# Patient Record
Sex: Female | Born: 1960
Health system: Southern US, Community
[De-identification: ages and names within clinical notes are randomized; demographics above are authoritative.]

## PROBLEM LIST (undated history)

## (undated) DIAGNOSIS — K5909 Other constipation: Secondary | ICD-10-CM

## (undated) DIAGNOSIS — T7840XA Allergy, unspecified, initial encounter: Secondary | ICD-10-CM

## (undated) DIAGNOSIS — K8020 Calculus of gallbladder without cholecystitis without obstruction: Secondary | ICD-10-CM

## (undated) DIAGNOSIS — K760 Fatty (change of) liver, not elsewhere classified: Secondary | ICD-10-CM

## (undated) HISTORY — PX: GALLBLADDER SURGERY: SHX652

## (undated) HISTORY — DX: Other constipation: K59.09

## (undated) HISTORY — PX: CHOLECYSTECTOMY: SHX55

## (undated) HISTORY — PX: GASTRIC BYPASS: SHX52

## (undated) HISTORY — PX: BARIATRIC SURGERY: SHX1103

## (undated) HISTORY — DX: Allergy, unspecified, initial encounter: T78.40XA

## (undated) HISTORY — DX: Fatty (change of) liver, not elsewhere classified: K76.0

## (undated) HISTORY — DX: Calculus of gallbladder without cholecystitis without obstruction: K80.20

## (undated) HISTORY — PX: ENDOMETRIAL ABLATION: SHX621

## (undated) HISTORY — PX: DILATION AND CURETTAGE OF UTERUS: SHX78

---

## 1998-11-28 ENCOUNTER — Emergency Department (HOSPITAL_COMMUNITY): Admission: EM | Admit: 1998-11-28 | Discharge: 1998-11-28 | Payer: Self-pay | Admitting: Emergency Medicine

## 2000-02-25 ENCOUNTER — Other Ambulatory Visit: Admission: RE | Admit: 2000-02-25 | Discharge: 2000-02-25 | Payer: Self-pay | Admitting: Obstetrics and Gynecology

## 2006-09-16 ENCOUNTER — Encounter: Admission: RE | Admit: 2006-09-16 | Discharge: 2006-09-16 | Payer: Self-pay | Admitting: Obstetrics and Gynecology

## 2007-06-19 ENCOUNTER — Encounter: Admission: RE | Admit: 2007-06-19 | Discharge: 2007-06-19 | Payer: Self-pay | Admitting: Family Medicine

## 2008-10-24 ENCOUNTER — Encounter (INDEPENDENT_AMBULATORY_CARE_PROVIDER_SITE_OTHER): Payer: Self-pay | Admitting: Obstetrics and Gynecology

## 2008-10-24 ENCOUNTER — Ambulatory Visit (HOSPITAL_COMMUNITY): Admission: RE | Admit: 2008-10-24 | Discharge: 2008-10-24 | Payer: Self-pay | Admitting: Obstetrics and Gynecology

## 2010-11-04 ENCOUNTER — Other Ambulatory Visit: Payer: Self-pay | Admitting: Obstetrics and Gynecology

## 2010-11-04 DIAGNOSIS — N6321 Unspecified lump in the left breast, upper outer quadrant: Secondary | ICD-10-CM

## 2010-11-10 ENCOUNTER — Ambulatory Visit
Admission: RE | Admit: 2010-11-10 | Discharge: 2010-11-10 | Disposition: A | Discharge status: Home / Self Care | Payer: BC Managed Care – PPO | Source: Ambulatory Visit | Attending: Obstetrics and Gynecology | Admitting: Obstetrics and Gynecology

## 2010-11-10 DIAGNOSIS — N6321 Unspecified lump in the left breast, upper outer quadrant: Secondary | ICD-10-CM

## 2011-01-11 LAB — BASIC METABOLIC PANEL
BUN: 9 mg/dL (ref 6–23)
CO2: 27 mEq/L (ref 19–32)
Calcium: 8.8 mg/dL (ref 8.4–10.5)
Chloride: 101 mEq/L (ref 96–112)
Creatinine, Ser: 0.69 mg/dL (ref 0.4–1.2)
GFR calc Af Amer: >60 mL/min (ref >60)
GFR calc non Af Amer: >60 mL/min (ref >60)
Glucose, Bld: 91 mg/dL (ref 70–99)
Potassium: 3.3 mEq/L — ABNORMAL LOW (ref 3.5–5.1)
Sodium: 134 mEq/L — ABNORMAL LOW (ref 135–145)

## 2011-01-11 LAB — PREGNANCY, URINE: Preg Test, Ur: NEGATIVE

## 2011-01-11 LAB — CBC
HCT: 43 % (ref 36.0–46.0)
Hemoglobin: 14.6 g/dL (ref 12.0–15.0)
MCHC: 33.9 g/dL (ref 30.0–36.0)
MCV: 84 fL (ref 78.0–100.0)
Platelets: 247 10*3/uL (ref 150–400)
RBC: 5.11 MIL/uL (ref 3.87–5.11)
RDW: 12.7 % (ref 11.5–15.5)
WBC: 7.2 10*3/uL (ref 4.0–10.5)

## 2011-02-09 NOTE — Op Note (Signed)
NAME:  STEPH, CHEADLE NO.:  [id number]   MEDICAL RECORD NO.:  [id number]          PATIENT TYPE:  AMB   LOCATION:  SDC                           FACILITY:  [facility]   PHYSICIAN:  Malva Limes, M.D.    DATE OF BIRTH:  Feb 02, 1961   DATE OF PROCEDURE:  DATE OF DISCHARGE:                               OPERATIVE REPORT   PREOPERATIVE DIAGNOSIS:  Menorrhagia.   POSTOPERATIVE DIAGNOSIS:  Menorrhagia.   PROCEDURE:  1. Dilation and curettage.  2. Endometrial ablation with NovaSure device.   SURGEON:  Malva Limes, MD   ANESTHESIA:  General.   ANTIBIOTICS:  Ancef 1 g.   DRAINS:  Red rubber catheter bladder.   SPECIMENS:  Endometrial curettings sent to Pathology.   COMPLICATIONS:  None.   PROCEDURE:  The patient was taken to the operating room where she was  placed in the dorsal supine position.  A general anesthetic was  administered without difficulty.  She was then placed in dorsal  lithotomy position.  She was prepped and draped in the usual fashion for  this procedure.  A sterile speculum placed in the vagina.  A 16 mL of 1%  lidocaine was used for paracervical block.  A single-tooth tenaculum was  applied to the anterior cervical lip.  The uterus was then sounded to 11  cm.  The cervix was then serially dilated to 27-French.  The cervical  length was measured at 3.5 cm.  The patient then had a sharp curettage  with tissue being sent to Pathology.  The NovaSure device was then  placed into the uterine cavity and the width was 4.4 cm.  A seal test  was then performed and passed.  the device was then turned on for a  total of 1 minute and 5 seconds giving 157 W of energy.  The patient  tolerated the procedure well.  The device was removed.  The patient was  then awakened and taken to recovery room in stable condition.  Instrument and lap counts were correct x1.  The patient will be  discharged to home.  She will be followed up in the office in 4 weeks.  She  will be sent home with Percocet to take p.r.n.           ______________________________  Malva Limes, M.D.     MA/MEDQ  D:  10/24/2008  T:  10/25/2008  Job:  [id number]

## 2012-02-18 ENCOUNTER — Other Ambulatory Visit: Payer: Self-pay | Admitting: Dermatology

## 2015-12-25 ENCOUNTER — Encounter: Payer: Self-pay | Admitting: Gastroenterology

## 2016-02-16 ENCOUNTER — Encounter: Payer: Self-pay | Admitting: *Deleted

## 2016-02-18 ENCOUNTER — Ambulatory Visit: Payer: BC Managed Care – PPO | Admitting: Gastroenterology

## 2016-02-18 ENCOUNTER — Telehealth: Payer: Self-pay | Admitting: *Deleted

## 2016-02-18 NOTE — Telephone Encounter (Signed)
No show letter mailed to patient. 

## 2017-01-07 DIAGNOSIS — D1801 Hemangioma of skin and subcutaneous tissue: Secondary | ICD-10-CM | POA: Diagnosis not present

## 2017-01-07 DIAGNOSIS — D225 Melanocytic nevi of trunk: Secondary | ICD-10-CM | POA: Diagnosis not present

## 2017-01-07 DIAGNOSIS — D2262 Melanocytic nevi of left upper limb, including shoulder: Secondary | ICD-10-CM | POA: Diagnosis not present

## 2017-01-07 DIAGNOSIS — L821 Other seborrheic keratosis: Secondary | ICD-10-CM | POA: Diagnosis not present

## 2017-01-07 DIAGNOSIS — D485 Neoplasm of uncertain behavior of skin: Secondary | ICD-10-CM | POA: Diagnosis not present

## 2017-02-09 DIAGNOSIS — L0202 Furuncle of face: Secondary | ICD-10-CM | POA: Diagnosis not present

## 2017-02-09 DIAGNOSIS — L0889 Other specified local infections of the skin and subcutaneous tissue: Secondary | ICD-10-CM | POA: Diagnosis not present

## 2017-03-01 ENCOUNTER — Other Ambulatory Visit: Payer: Self-pay | Admitting: Obstetrics and Gynecology

## 2017-03-01 DIAGNOSIS — N6452 Nipple discharge: Secondary | ICD-10-CM | POA: Diagnosis not present

## 2017-03-01 DIAGNOSIS — N6341 Unspecified lump in right breast, subareolar: Secondary | ICD-10-CM | POA: Diagnosis not present

## 2017-03-01 DIAGNOSIS — Z01419 Encounter for gynecological examination (general) (routine) without abnormal findings: Secondary | ICD-10-CM | POA: Diagnosis not present

## 2017-03-01 DIAGNOSIS — Z124 Encounter for screening for malignant neoplasm of cervix: Secondary | ICD-10-CM | POA: Diagnosis not present

## 2017-03-01 DIAGNOSIS — R102 Pelvic and perineal pain: Secondary | ICD-10-CM | POA: Diagnosis not present

## 2017-03-01 DIAGNOSIS — N63 Unspecified lump in unspecified breast: Secondary | ICD-10-CM

## 2017-03-02 ENCOUNTER — Other Ambulatory Visit: Payer: Self-pay | Admitting: Obstetrics and Gynecology

## 2017-03-02 DIAGNOSIS — N6452 Nipple discharge: Secondary | ICD-10-CM

## 2017-03-02 DIAGNOSIS — N63 Unspecified lump in unspecified breast: Secondary | ICD-10-CM

## 2017-03-07 ENCOUNTER — Ambulatory Visit
Admission: RE | Admit: 2017-03-07 | Discharge: 2017-03-07 | Disposition: A | Discharge status: Home / Self Care | Payer: BLUE CROSS/BLUE SHIELD | Source: Ambulatory Visit | Attending: Obstetrics and Gynecology | Admitting: Obstetrics and Gynecology

## 2017-03-07 ENCOUNTER — Other Ambulatory Visit: Payer: Self-pay | Admitting: Obstetrics and Gynecology

## 2017-03-07 DIAGNOSIS — N63 Unspecified lump in unspecified breast: Secondary | ICD-10-CM

## 2017-03-07 DIAGNOSIS — N644 Mastodynia: Secondary | ICD-10-CM | POA: Diagnosis not present

## 2017-03-07 DIAGNOSIS — N6452 Nipple discharge: Secondary | ICD-10-CM

## 2017-03-14 ENCOUNTER — Other Ambulatory Visit: Payer: Self-pay | Admitting: Obstetrics and Gynecology

## 2017-03-14 DIAGNOSIS — R921 Mammographic calcification found on diagnostic imaging of breast: Secondary | ICD-10-CM

## 2017-03-16 DIAGNOSIS — R102 Pelvic and perineal pain: Secondary | ICD-10-CM | POA: Diagnosis not present

## 2017-09-06 ENCOUNTER — Other Ambulatory Visit: Payer: BLUE CROSS/BLUE SHIELD

## 2017-09-12 ENCOUNTER — Other Ambulatory Visit: Payer: Self-pay

## 2017-09-14 ENCOUNTER — Ambulatory Visit
Admission: RE | Admit: 2017-09-14 | Discharge: 2017-09-14 | Disposition: A | Discharge status: Home / Self Care | Payer: PRIVATE HEALTH INSURANCE | Source: Ambulatory Visit | Attending: Obstetrics and Gynecology | Admitting: Obstetrics and Gynecology

## 2017-09-14 ENCOUNTER — Other Ambulatory Visit: Payer: Self-pay | Admitting: Obstetrics and Gynecology

## 2017-09-14 DIAGNOSIS — R921 Mammographic calcification found on diagnostic imaging of breast: Secondary | ICD-10-CM

## 2017-09-28 ENCOUNTER — Encounter: Payer: Self-pay | Admitting: Physician Assistant

## 2017-09-28 ENCOUNTER — Other Ambulatory Visit: Payer: Self-pay

## 2017-09-28 ENCOUNTER — Ambulatory Visit (INDEPENDENT_AMBULATORY_CARE_PROVIDER_SITE_OTHER): Payer: BLUE CROSS/BLUE SHIELD | Admitting: Physician Assistant

## 2017-09-28 ENCOUNTER — Ambulatory Visit (INDEPENDENT_AMBULATORY_CARE_PROVIDER_SITE_OTHER): Payer: BLUE CROSS/BLUE SHIELD

## 2017-09-28 VITALS — BP 136/88 | HR 77 | Temp 98.3°F | Resp 20 | Ht 63.78 in | Wt 281.6 lb

## 2017-09-28 DIAGNOSIS — R7611 Nonspecific reaction to tuberculin skin test without active tuberculosis: Secondary | ICD-10-CM | POA: Diagnosis not present

## 2017-09-28 NOTE — Progress Notes (Signed)
Brandy Lewis  MRN: [id number] DOB: [birth year]  Subjective:  Brandy Lewis is a 57 y.o. female seen in office today for a chief complaint of need of chest x-ray.  Patient tested positive for TB via skin test 20 years ago.  She followed up with health department and completed treatment for 1 year.  She had repeat chest x-rays every 3 months for a year and then every 10 years after that.  She has not had one in about 10 years.  Her chest x-rays have always been negative.  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  Patient  has never been symptomatic and remains asymptomatic.  She denies cough, night sweats, hemoptysis, fever, chills, weight loss, and fatigue.  She has no other questions or concerns today.  Review of Systems  Per HPI  There are no active problems to display for this patient.   No current outpatient medications on file prior to visit.   No current facility-administered medications on file prior to visit.     Allergies  Allergen Reactions  . Diflucan [Fluconazole]   . Latex    Social History   Socioeconomic History  . Marital status: Married    Spouse name: Elta Guadeloupe  . Number of children: 1  . Years of education: Not on file  . Highest education level: Not on file  Social Needs  . Financial resource strain: Not on file  . Food insecurity - worry: Not on file  . Food insecurity - inability: Not on file  . Transportation needs - medical: Not on file  . Transportation needs - non-medical: Not on file  Occupational History  . Not on file  Tobacco Use  . Smoking status: Never Smoker  . Smokeless tobacco: Never Used  Substance and Sexual Activity  . Alcohol use: No    Frequency: Never  . Drug use: No  . Sexual activity: Yes  Other Topics Concern  . Not on file  Social History Narrative  . Not on file      Objective:  BP 136/88 (BP Location: Left Arm, Patient Position: Sitting, Cuff Size: Large)   Pulse 77   Temp 98.3  F (36.8 C) (Oral)   Resp 20   Ht 5' 3.78" (1.62 m)   Wt 281 lb 9.6 oz (127.7 kg)   SpO2 99%   BMI 48.67 kg/m   Physical Exam  Constitutional: She is oriented to person, place, and time and well-developed, well-nourished, and in no distress.  HENT:  Head: Normocephalic and atraumatic.  Eyes: Conjunctivae are normal.  Neck: Normal range of motion.  Cardiovascular: Normal rate, regular rhythm and normal heart sounds.  Pulmonary/Chest: Effort normal and breath sounds normal. She has no wheezes. She has no rhonchi. She has no rales.  Neurological: She is alert and oriented to person, place, and time. Gait normal.  Skin: Skin is warm and dry.  Psychiatric: Affect normal.  Vitals reviewed.   Dg Chest 2 View  Result Date: 09/28/2017 CLINICAL DATA:  Positive TB test 20 ears ago, pre-employment EXAM: CHEST  2 VIEW COMPARISON:  None. FINDINGS: Normal heart size, mediastinal contours, and pulmonary vascularity. Lungs clear. No pleural effusion or pneumothorax. Bones unremarkable. IMPRESSION: Normal exam. Electronically Signed   By: Lavonia Dana M.D.   On: 09/28/2017 16:42    Assessment and Plan :  1. Positive TB test Chest x-ray normal.  Will have CMA fax CXR results to [facility]  [place] at [phone].  Follow-up as needed. - DG Chest 2 View; Future  Tenna Delaine PA-C  [facility] 09/28/2017 4:18 PM

## 2017-09-28 NOTE — Patient Instructions (Addendum)
Please go to 102 for chest xray. We will fax your results to the number provided. Thank you for letting me participate in your health and well being.     IF you received an x-ray today, you will receive an invoice from [facility]. Please contact [facility] at [phone] with questions or concerns regarding your invoice.   IF you received labwork today, you will receive an invoice from [facility]. Please contact [facility] at [phone] with questions or concerns regarding your invoice.   Our billing staff will not be able to assist you with questions regarding bills from these companies.  You will be contacted with the lab results as soon as they are available. The fastest way to get your results is to activate your My Chart account. Instructions are located on the last page of this paperwork. If you have not heard from [place] regarding the results in 2 weeks, please contact this office.

## 2017-10-13 DIAGNOSIS — N6323 Unspecified lump in the left breast, lower outer quadrant: Secondary | ICD-10-CM | POA: Diagnosis not present

## 2017-10-13 DIAGNOSIS — R921 Mammographic calcification found on diagnostic imaging of breast: Secondary | ICD-10-CM | POA: Diagnosis not present

## 2017-10-18 DIAGNOSIS — D225 Melanocytic nevi of trunk: Secondary | ICD-10-CM | POA: Diagnosis not present

## 2017-10-18 DIAGNOSIS — D485 Neoplasm of uncertain behavior of skin: Secondary | ICD-10-CM | POA: Diagnosis not present

## 2018-03-17 ENCOUNTER — Other Ambulatory Visit: Payer: PRIVATE HEALTH INSURANCE

## 2018-06-27 ENCOUNTER — Encounter: Payer: Self-pay | Admitting: Gastroenterology

## 2018-08-02 ENCOUNTER — Ambulatory Visit (INDEPENDENT_AMBULATORY_CARE_PROVIDER_SITE_OTHER): Payer: 59 | Admitting: Gastroenterology

## 2018-08-02 ENCOUNTER — Encounter: Payer: Self-pay | Admitting: Gastroenterology

## 2018-08-02 VITALS — BP 130/86 | HR 76 | Ht 63.0 in | Wt 289.4 lb

## 2018-08-02 DIAGNOSIS — K625 Hemorrhage of anus and rectum: Secondary | ICD-10-CM | POA: Diagnosis not present

## 2018-08-02 DIAGNOSIS — K6289 Other specified diseases of anus and rectum: Secondary | ICD-10-CM | POA: Diagnosis not present

## 2018-08-02 MED ORDER — NA SULFATE-K SULFATE-MG SULF 17.5-3.13-1.6 GM/177ML PO SOLN: ORAL | 0 refills | Status: DC

## 2018-08-02 MED ORDER — AMBULATORY NON FORMULARY MEDICATION: 1 refills | Status: DC

## 2018-08-02 NOTE — H&P (View-Only) (Signed)
Brandy Lewis    [id number]    [birth year]  Primary Care Physician:Slatosky, Marshall Cork., MD  Referring Physician: Enid Skeens., MD [address], [place] [address]  Chief complaint:  Rectal bleeding, rectal pain  HPI: 57 year old female here for new patient visit complains of intermittent bright red blood per rectum and symptomatic hemorrhoids. Has had issues with hemorrhoids since pregnancy 25 years ago She noticed a nodule between hemorrhoid and skin tag, its painful to sit and to have a bowel movement and also having intermittent bright red blood per rectum with increased frequency in the past  3 to 4 months  BM about once a day, no recent change in bowel habits. Maternal uncle with colon cancer in his 35's, Mom had lung cancer. Denies any abdominal pain, loss of appetite or weight loss.    Outpatient Encounter Medications as of 08/02/2018  Medication Sig  . fexofenadine (ALLEGRA ALLERGY) 180 MG tablet Take 1 tablet by mouth daily.   No facility-administered encounter medications on file as of 08/02/2018.     Allergies as of 08/02/2018 - Review Complete 08/02/2018  Allergen Reaction Noted  . Diflucan [fluconazole]  09/28/2017  . Latex  09/28/2017  . Tape Rash 07/18/2018    Past Medical History:  Diagnosis Date  . Allergy   . Fatty liver   . Gallstones     Past Surgical History:  Procedure Laterality Date  . BARIATRIC SURGERY    . DILATION AND CURETTAGE OF UTERUS    . ENDOMETRIAL ABLATION    . GALLBLADDER SURGERY    . GASTRIC BYPASS      Family History  Problem Relation Age of Onset  . Lung cancer Mother   . Diabetes Father   . Heart disease Father     Social History   Socioeconomic History  . Marital status: Married    Spouse name: Elta Guadeloupe  . Number of children: 1  . Years of education: Not on file  . Highest education level: Not on file  Occupational History  . Not on file  Social Needs  . Financial resource strain: Not on  file  . Food insecurity:    Worry: Not on file    Inability: Not on file  . Transportation needs:    Medical: Not on file    Non-medical: Not on file  Tobacco Use  . Smoking status: Never Smoker  . Smokeless tobacco: Never Used  Substance and Sexual Activity  . Alcohol use: No    Frequency: Never  . Drug use: No  . Sexual activity: Yes  Lifestyle  . Physical activity:    Days per week: Not on file    Minutes per session: Not on file  . Stress: Not on file  Relationships  . Social connections:    Talks on phone: Not on file    Gets together: Not on file    Attends religious service: Not on file    Active member of club or organization: Not on file    Attends meetings of clubs or organizations: Not on file    Relationship status: Not on file  . Intimate partner violence:    Fear of current or ex partner: Not on file    Emotionally abused: Not on file    Physically abused: Not on file    Forced sexual activity: Not on file  Other Topics Concern  . Not on file  Social History Narrative  .  Not on file      Review of systems: Review of Systems  Constitutional: Negative for fever and chills.  HENT: Negative.   Eyes: Negative for blurred vision.  Respiratory: Negative for cough, shortness of breath and wheezing.   Cardiovascular: Negative for chest pain and palpitations.  Gastrointestinal: as per HPI Genitourinary: Negative for dysuria, urgency, frequency and hematuria.  Musculoskeletal: Negative for myalgias, back pain and joint pain.  Skin: Negative for itching and rash.  Neurological: Negative for dizziness, tremors, focal weakness, seizures and loss of consciousness.  Endo/Heme/Allergies: Positive for seasonal allergies.  Psychiatric/Behavioral: Negative for depression, suicidal ideas and hallucinations.  All other systems reviewed and are negative.   Physical Exam: Vitals:   08/02/18 0855  BP: 130/86  Pulse: 76   Body mass index is 51.26 kg/m. Gen:       No acute distress HEENT:  EOMI, sclera anicteric Neck:     No masses; no thyromegaly Lungs:    Clear to auscultation bilaterally; normal respiratory effort CV:         Regular rate and rhythm; no murmurs Abd:      + bowel sounds; soft, non-tender; no palpable masses, no distension Ext:    No edema; adequate peripheral perfusion Skin:      Warm and dry; no rash Neuro: alert and oriented x 3 Psych: normal mood and affect Rectal exam: Normal anal sphincter tone, significant rectal tenderness, firm nodule with ?defect/ulcer/ fissure palpable posteriorly .  Anoscopy: not performed due to significant pain Data Reviewed:  Reviewed labs, radiology imaging, old records and pertinent past GI work up   Assessment and Plan/Recommendations:  57 year old female with history of GERD, fatty liver, morbid obesity and family history of colon cancer in second-degree relative with complaints of intermittent rectal bleeding.  Abnormal rectal digital exam with firm palpable nodule and significant rectal tenderness. Scheduled for colonoscopy with biopsy for further evaluation The risks and benefits as well as alternatives of endoscopic procedure(s) have been discussed and reviewed. All questions answered. The patient agrees to proceed. Benefiber 1 teaspoon 3 times daily with meals RectiCare small pea-sized amount per rectum as needed and apply nitroglycerin 0.125% small pea-sized amount 3 times daily to improve rectal discomfort   K. Denzil Magnuson , MD [phone]    CC: Enid Skeens., MD

## 2018-08-02 NOTE — Progress Notes (Signed)
Brandy Lewis    [id number]    05-13-1961  Primary Care Physician:Slatosky, Marshall Cork., MD  Referring Physician: Enid Skeens., MD [address], [place] [address]  Chief complaint:  Rectal bleeding, rectal pain  HPI: 57 year old female here for new patient visit complains of intermittent bright red blood per rectum and symptomatic hemorrhoids. Has had issues with hemorrhoids since pregnancy 25 years ago She noticed a nodule between hemorrhoid and skin tag, its painful to sit and to have a bowel movement and also having intermittent bright red blood per rectum with increased frequency in the past  3 to 4 months  BM about once a day, no recent change in bowel habits. Maternal uncle with colon cancer in his 17's, Mom had lung cancer. Denies any abdominal pain, loss of appetite or weight loss.    Outpatient Encounter Medications as of 08/02/2018  Medication Sig  . fexofenadine (ALLEGRA ALLERGY) 180 MG tablet Take 1 tablet by mouth daily.   No facility-administered encounter medications on file as of 08/02/2018.     Allergies as of 08/02/2018 - Review Complete 08/02/2018  Allergen Reaction Noted  . Diflucan [fluconazole]  09/28/2017  . Latex  09/28/2017  . Tape Rash 07/18/2018    Past Medical History:  Diagnosis Date  . Allergy   . Fatty liver   . Gallstones     Past Surgical History:  Procedure Laterality Date  . BARIATRIC SURGERY    . DILATION AND CURETTAGE OF UTERUS    . ENDOMETRIAL ABLATION    . GALLBLADDER SURGERY    . GASTRIC BYPASS      Family History  Problem Relation Age of Onset  . Lung cancer Mother   . Diabetes Father   . Heart disease Father     Social History   Socioeconomic History  . Marital status: Married    Spouse name: Elta Guadeloupe  . Number of children: 1  . Years of education: Not on file  . Highest education level: Not on file  Occupational History  . Not on file  Social Needs  . Financial resource strain: Not on  file  . Food insecurity:    Worry: Not on file    Inability: Not on file  . Transportation needs:    Medical: Not on file    Non-medical: Not on file  Tobacco Use  . Smoking status: Never Smoker  . Smokeless tobacco: Never Used  Substance and Sexual Activity  . Alcohol use: No    Frequency: Never  . Drug use: No  . Sexual activity: Yes  Lifestyle  . Physical activity:    Days per week: Not on file    Minutes per session: Not on file  . Stress: Not on file  Relationships  . Social connections:    Talks on phone: Not on file    Gets together: Not on file    Attends religious service: Not on file    Active member of club or organization: Not on file    Attends meetings of clubs or organizations: Not on file    Relationship status: Not on file  . Intimate partner violence:    Fear of current or ex partner: Not on file    Emotionally abused: Not on file    Physically abused: Not on file    Forced sexual activity: Not on file  Other Topics Concern  . Not on file  Social History Narrative  .  Not on file      Review of systems: Review of Systems  Constitutional: Negative for fever and chills.  HENT: Negative.   Eyes: Negative for blurred vision.  Respiratory: Negative for cough, shortness of breath and wheezing.   Cardiovascular: Negative for chest pain and palpitations.  Gastrointestinal: as per HPI Genitourinary: Negative for dysuria, urgency, frequency and hematuria.  Musculoskeletal: Negative for myalgias, back pain and joint pain.  Skin: Negative for itching and rash.  Neurological: Negative for dizziness, tremors, focal weakness, seizures and loss of consciousness.  Endo/Heme/Allergies: Positive for seasonal allergies.  Psychiatric/Behavioral: Negative for depression, suicidal ideas and hallucinations.  All other systems reviewed and are negative.   Physical Exam: Vitals:   08/02/18 0855  BP: 130/86  Pulse: 76   Body mass index is 51.26 kg/m. Gen:       No acute distress HEENT:  EOMI, sclera anicteric Neck:     No masses; no thyromegaly Lungs:    Clear to auscultation bilaterally; normal respiratory effort CV:         Regular rate and rhythm; no murmurs Abd:      + bowel sounds; soft, non-tender; no palpable masses, no distension Ext:    No edema; adequate peripheral perfusion Skin:      Warm and dry; no rash Neuro: alert and oriented x 3 Psych: normal mood and affect Rectal exam: Normal anal sphincter tone, significant rectal tenderness, firm nodule with ?defect/ulcer/ fissure palpable posteriorly .  Anoscopy: not performed due to significant pain Data Reviewed:  Reviewed labs, radiology imaging, old records and pertinent past GI work up   Assessment and Plan/Recommendations:  57 year old female with history of GERD, fatty liver, morbid obesity and family history of colon cancer in second-degree relative with complaints of intermittent rectal bleeding.  Abnormal rectal digital exam with firm palpable nodule and significant rectal tenderness. Scheduled for colonoscopy with biopsy for further evaluation The risks and benefits as well as alternatives of endoscopic procedure(s) have been discussed and reviewed. All questions answered. The patient agrees to proceed. Benefiber 1 teaspoon 3 times daily with meals RectiCare small pea-sized amount per rectum as needed and apply nitroglycerin 0.125% small pea-sized amount 3 times daily to improve rectal discomfort   K. Denzil Magnuson , MD [phone]    CC: Enid Skeens., MD

## 2018-08-02 NOTE — Patient Instructions (Addendum)
We have scheduled you for a hospital case on 08/10/2018 at 2pm, separate instructions have been given   Take Benefiber 1 teaspoon three tines a day with meals  You will purchase Recticare OTC to mix with Nitroglycerin three times a day  If you are age 56 or older, your body mass index should be between 23-30. Your Body mass index is 51.26 kg/m. If this is out of the aforementioned range listed, please consider follow up with your Primary Care Provider.  If you are age 95 or younger, your body mass index should be between 19-25. Your Body mass index is 51.26 kg/m. If this is out of the aformentioned range listed, please consider follow up with your Primary Care Provider.    Thank you for choosing [facility]  Karleen Hampshire Nandigam,MD

## 2018-08-03 ENCOUNTER — Telehealth: Payer: Self-pay | Admitting: *Deleted

## 2018-08-03 NOTE — Telephone Encounter (Signed)
Called patient and left message that her colon has been rescheduled for 08/11/2018 at 1pm to arrive at 11:30am

## 2018-08-10 ENCOUNTER — Telehealth: Payer: Self-pay

## 2018-08-10 NOTE — Telephone Encounter (Signed)
SENT REFERRAL TO SCHEDULING AND FILED NOTES 

## 2018-08-11 ENCOUNTER — Encounter (HOSPITAL_COMMUNITY)
Admission: RE | Disposition: A | Discharge status: Home / Self Care | Payer: Self-pay | Source: Ambulatory Visit | Attending: Gastroenterology

## 2018-08-11 ENCOUNTER — Ambulatory Visit (HOSPITAL_COMMUNITY): Payer: 59 | Admitting: Anesthesiology

## 2018-08-11 ENCOUNTER — Encounter: Payer: Self-pay | Admitting: Gastroenterology

## 2018-08-11 ENCOUNTER — Encounter (HOSPITAL_COMMUNITY): Payer: Self-pay | Admitting: Gastroenterology

## 2018-08-11 ENCOUNTER — Other Ambulatory Visit: Payer: Self-pay

## 2018-08-11 ENCOUNTER — Ambulatory Visit (HOSPITAL_COMMUNITY)
Admission: RE | Admit: 2018-08-11 | Discharge: 2018-08-11 | Disposition: A | Discharge status: Home / Self Care | Payer: 59 | Source: Ambulatory Visit | Attending: Gastroenterology | Admitting: Gastroenterology

## 2018-08-11 DIAGNOSIS — K922 Gastrointestinal hemorrhage, unspecified: Secondary | ICD-10-CM | POA: Diagnosis present

## 2018-08-11 DIAGNOSIS — K5730 Diverticulosis of large intestine without perforation or abscess without bleeding: Secondary | ICD-10-CM | POA: Diagnosis not present

## 2018-08-11 DIAGNOSIS — K648 Other hemorrhoids: Secondary | ICD-10-CM | POA: Insufficient documentation

## 2018-08-11 DIAGNOSIS — Z800 Family history of malignant neoplasm of digestive organs: Secondary | ICD-10-CM | POA: Insufficient documentation

## 2018-08-11 DIAGNOSIS — K6289 Other specified diseases of anus and rectum: Secondary | ICD-10-CM

## 2018-08-11 DIAGNOSIS — Z6841 Body Mass Index (BMI) 40.0 and over, adult: Secondary | ICD-10-CM | POA: Insufficient documentation

## 2018-08-11 DIAGNOSIS — K621 Rectal polyp: Secondary | ICD-10-CM | POA: Insufficient documentation

## 2018-08-11 DIAGNOSIS — K625 Hemorrhage of anus and rectum: Secondary | ICD-10-CM

## 2018-08-11 DIAGNOSIS — Z9884 Bariatric surgery status: Secondary | ICD-10-CM | POA: Insufficient documentation

## 2018-08-11 DIAGNOSIS — D122 Benign neoplasm of ascending colon: Secondary | ICD-10-CM | POA: Insufficient documentation

## 2018-08-11 HISTORY — PX: POLYPECTOMY: SHX5525

## 2018-08-11 HISTORY — PX: COLONOSCOPY WITH PROPOFOL: SHX5780

## 2018-08-11 HISTORY — PX: BIOPSY: SHX5522

## 2018-08-11 SURGERY — COLONOSCOPY WITH PROPOFOL
Anesthesia: Monitor Anesthesia Care

## 2018-08-11 MED ORDER — MIDAZOLAM HCL 2 MG/2ML IJ SOLN: 0.5000 mg | Freq: Once | INTRAMUSCULAR | Status: DC | PRN

## 2018-08-11 MED ORDER — SODIUM CHLORIDE 0.9 % IV SOLN: INTRAVENOUS | Status: DC

## 2018-08-11 MED ORDER — HYDRALAZINE HCL 20 MG/ML IJ SOLN
INTRAMUSCULAR | Status: DC | PRN
  Administered 2018-08-11: 5 mg via INTRAVENOUS

## 2018-08-11 MED ORDER — PROPOFOL 10 MG/ML IV BOLUS
INTRAVENOUS | Status: AC
  Filled 2018-08-11: qty 20

## 2018-08-11 MED ORDER — MEPERIDINE HCL 25 MG/ML IJ SOLN: 6.2500 mg | INTRAMUSCULAR | Status: DC | PRN

## 2018-08-11 MED ORDER — LACTATED RINGERS IV SOLN
INTRAVENOUS | Status: DC
  Administered 2018-08-11: 1000 mL via INTRAVENOUS

## 2018-08-11 MED ORDER — LIDOCAINE HCL (PF) 2 % IJ SOLN
INTRAMUSCULAR | Status: DC | PRN
  Administered 2018-08-11: 30 mg via INTRADERMAL

## 2018-08-11 MED ORDER — PROMETHAZINE HCL 25 MG/ML IJ SOLN: 6.2500 mg | INTRAMUSCULAR | Status: DC | PRN

## 2018-08-11 MED ORDER — PROPOFOL 10 MG/ML IV BOLUS
INTRAVENOUS | Status: DC | PRN
  Administered 2018-08-11 (×10): 20 mg via INTRAVENOUS
  Administered 2018-08-11 (×3): 50 mg via INTRAVENOUS
  Administered 2018-08-11 (×3): 20 mg via INTRAVENOUS
  Administered 2018-08-11: 50 mg via INTRAVENOUS

## 2018-08-11 MED ORDER — PROPOFOL 10 MG/ML IV BOLUS
INTRAVENOUS | Status: AC
  Filled 2018-08-11: qty 40

## 2018-08-11 SURGICAL SUPPLY — 22 items

## 2018-08-11 NOTE — Interval H&P Note (Signed)
History and Physical Interval Note:  08/11/2018 12:05 PM  Brandy Lewis  has presented today for surgery, with the diagnosis of BRBPR  Rectal pain  BMI greater than 50  The various methods of treatment have been discussed with the patient and family. After consideration of risks, benefits and other options for treatment, the patient has consented to  Procedure(s): COLONOSCOPY WITH PROPOFOL ([name]) as a surgical intervention .  The patient's history has been reviewed, patient examined, no change in status, stable for surgery.  I have reviewed the patient's chart and labs.  Questions were answered to the patient's satisfaction.     Kavitha Nandigam

## 2018-08-11 NOTE — Discharge Instructions (Signed)
YOU HAD AN ENDOSCOPIC PROCEDURE TODAY: Refer to the procedure report and other information in the discharge instructions given to you for any specific questions about what was found during the examination. If this information does not answer your questions, please call [place] office at [phone] to clarify.  ° °YOU SHOULD EXPECT: Some feelings of bloating in the abdomen. Passage of more gas than usual. Walking can help get rid of the air that was put into your GI tract during the procedure and reduce the bloating. If you had a lower endoscopy (such as a colonoscopy or flexible sigmoidoscopy) you may notice spotting of blood in your stool or on the toilet paper. Some abdominal soreness may be present for a day or two, also. ° °DIET: Your first meal following the procedure should be a light meal and then it is ok to progress to your normal diet. A half-sandwich or bowl of soup is an example of a good first meal. Heavy or fried foods are harder to digest and may make you feel nauseous or bloated. Drink plenty of fluids but you should avoid alcoholic beverages for 24 hours. If you had a esophageal dilation, please see attached instructions for diet.   ° °ACTIVITY: Your care partner should take you home directly after the procedure. You should plan to take it easy, moving slowly for the rest of the day. You can resume normal activity the day after the procedure however YOU SHOULD NOT DRIVE, use power tools, machinery or perform tasks that involve climbing or major physical exertion for 24 hours (because of the sedation medicines used during the test).  ° °SYMPTOMS TO REPORT IMMEDIATELY: °A gastroenterologist can be reached at any hour. Please call [phone]  for any of the following symptoms:  °Following lower endoscopy (colonoscopy, flexible sigmoidoscopy) °Excessive amounts of blood in the stool  °Significant tenderness, worsening of abdominal pains  °Swelling of the abdomen that is new, acute  °Fever of 100° or  higher  °Following upper endoscopy (EGD, EUS, ERCP, esophageal dilation) °Vomiting of blood or coffee ground material  °New, significant abdominal pain  °New, significant chest pain or pain under the shoulder blades  °Painful or persistently difficult swallowing  °New shortness of breath  °Black, tarry-looking or red, bloody stools ° °FOLLOW UP:  °If any biopsies were taken you will be contacted by phone or by letter within the next 1-3 weeks. Call [phone]  if you have not heard about the biopsies in 3 weeks.  °Please also call with any specific questions about appointments or follow up tests. ° °

## 2018-08-11 NOTE — Anesthesia Postprocedure Evaluation (Signed)
Anesthesia Post Note  Patient: Skyah Hannon  Procedure(s) Performed: COLONOSCOPY WITH PROPOFOL ([name] ) POLYPECTOMY BIOPSY     Patient location during evaluation: Endoscopy Anesthesia Type: MAC Level of consciousness: awake and alert, oriented and patient cooperative Pain management: pain level controlled Vital Signs Assessment: post-procedure vital signs reviewed and stable Respiratory status: spontaneous breathing, nonlabored ventilation and respiratory function stable Cardiovascular status: blood pressure returned to baseline and stable Postop Assessment: no apparent nausea or vomiting, adequate PO intake and able to ambulate Anesthetic complications: no    Last Vitals:  Vitals:   08/11/18 1340 08/11/18 1350  BP: 115/88 132/78  Pulse: 73 72  Resp: 20 14  Temp:    SpO2: 100% 100%    Last Pain:  Vitals:   08/11/18 1350  TempSrc:   PainSc: 0-No pain                 Luis Nickles,E. Ariannie Penaloza

## 2018-08-11 NOTE — Transfer of Care (Signed)
Immediate Anesthesia Transfer of Care Note  Patient: Brandy Lewis  Procedure(s) Performed: COLONOSCOPY WITH PROPOFOL ([name] ) POLYPECTOMY BIOPSY  Patient Location: PACU and Endoscopy Unit  Anesthesia Type:MAC  Level of Consciousness: awake, alert  and oriented  Airway & Oxygen Therapy: Patient Spontanous Breathing and Patient connected to face mask oxygen  Post-op Assessment: Report given to RN and Post -op Vital signs reviewed and stable  Post vital signs: Reviewed and stable  Last Vitals:  Vitals Value Taken Time  BP 129/68 08/11/2018  1:31 PM  Temp 36.6 C 08/11/2018  1:31 PM  Pulse 74 08/11/2018  1:33 PM  Resp 20 08/11/2018  1:33 PM  SpO2 100 % 08/11/2018  1:33 PM  Vitals shown include unvalidated device data.  Last Pain:  Vitals:   08/11/18 1331  TempSrc: Oral  PainSc: 0-No pain         Complications: No apparent anesthesia complications

## 2018-08-11 NOTE — Anesthesia Preprocedure Evaluation (Addendum)
Anesthesia Evaluation  Patient identified by MRN, date of birth, ID band Patient awake    Reviewed: Allergy & Precautions, NPO status , Patient's Chart, lab work & pertinent test results  History of Anesthesia Complications Negative for: history of anesthetic complications  Airway Mallampati: II  TM Distance: >3 FB Neck ROM: Full    Dental  (+) Dental Advisory Given   Pulmonary neg pulmonary ROS,    breath sounds clear to auscultation       Cardiovascular negative cardio ROS   Rhythm:Regular Rate:Normal     Neuro/Psych negative neurological ROS     GI/Hepatic Neg liver ROS, GERD  Controlled,  Endo/Other  Morbid obesity  Renal/GU negative Renal ROS     Musculoskeletal   Abdominal (+) + obese,   Peds  Hematology negative hematology ROS (+)   Anesthesia Other Findings   Reproductive/Obstetrics                            Anesthesia Physical Anesthesia Plan  ASA: II  Anesthesia Plan: MAC   Post-op Pain Management:    Induction:   PONV Risk Score and Plan: 2 and Treatment may vary due to age or medical condition  Airway Management Planned: Nasal Cannula and Natural Airway  Additional Equipment:   Intra-op Plan:   Post-operative Plan:   Informed Consent: I have reviewed the patients History and Physical, chart, labs and discussed the procedure including the risks, benefits and alternatives for the proposed anesthesia with the patient or authorized representative who has indicated his/her understanding and acceptance.   Dental advisory given  Plan Discussed with: CRNA and Surgeon  Anesthesia Plan Comments: (Plan routine monitors, MAC)        Anesthesia Quick Evaluation

## 2018-08-11 NOTE — Op Note (Signed)
[facility] Patient Name: Brandy Lewis Procedure Date: 08/11/2018 MRN: [id number] Attending MD: Mauri Pole , MD Date of Birth: 1960/12/17 CSN: [id number] Age: 57 Admit Type: Inpatient Procedure:                Colonoscopy Indications:              Evaluation of unexplained GI bleeding Providers:                Mauri Pole, MD, Zenon Mayo, RN, Alan Mulder, Technician, Karis Juba, CRNA Referring MD:              Medicines:                Monitored Anesthesia Care Complications:            No immediate complications. Estimated Blood Loss:     Estimated blood loss was minimal. Procedure:                Pre-Anesthesia Assessment:                           - Prior to the procedure, a History and Physical                            was performed, and patient medications and                            allergies were reviewed. The patient's tolerance of                            previous anesthesia was also reviewed. The risks                            and benefits of the procedure and the sedation                            options and risks were discussed with the patient.                            All questions were answered, and informed consent                            was obtained. Prior Anticoagulants: The patient has                            taken no previous anticoagulant or antiplatelet                            agents. ASA Grade Assessment: III - A patient with                            severe systemic disease. After reviewing the risks  and benefits, the patient was deemed in                            satisfactory condition to undergo the procedure.                           After obtaining informed consent, the colonoscope                            was passed under direct vision. Throughout the                            procedure, the patient's blood pressure, pulse, and                             oxygen saturations were monitored continuously. The                            PCF-[id number] ([id number] Olympus peds colonoscope was                            introduced through the anus and advanced to the the                            cecum, identified by appendiceal orifice and                            ileocecal valve. The colonoscopy was performed                            without difficulty. The patient tolerated the                            procedure well. The quality of the bowel                            preparation was excellent. The ileocecal valve,                            appendiceal orifice, and rectum were photographed. Scope In: 1:06:58 PM Scope Out: 1:23:20 PM Scope Withdrawal Time: 0 hours 13 minutes 6 seconds  Total Procedure Duration: 0 hours 16 minutes 22 seconds  Findings:      The perianal and digital rectal examinations were normal.      A 2 mm polyp was found in the ascending colon. The polyp was sessile.       The polyp was removed with a cold biopsy forceps. Resection and       retrieval were complete.      Anal papilla(e) were hypertrophied. Biopsies were taken with a cold       forceps for histology.      Scattered small-mouthed diverticula were found in the sigmoid colon.      Non-bleeding internal hemorrhoids were found during retroflexion. The       hemorrhoids were medium-sized. Impression:               -  One 2 mm polyp in the ascending colon, removed                            with a cold biopsy forceps. Resected and retrieved.                           - Anal papilla(e) were hypertrophied. Biopsied.                           - Diverticulosis in the sigmoid colon.                           - Non-bleeding internal hemorrhoids. Moderate Sedation:      Not Applicable - Patient had care per Anesthesia. Recommendation:           - Patient has a contact number available for                            emergencies. The signs  and symptoms of potential                            delayed complications were discussed with the                            patient. Return to normal activities tomorrow.                            Written discharge instructions were provided to the                            patient.                           - Resume previous diet.                           - Continue present medications.                           - Await pathology results.                           - Repeat colonoscopy in 5-10 years for surveillance                            based on pathology results. Procedure Code(s):        --- Professional ---                           [phone], Colonoscopy, flexible; with biopsy, single                            or multiple Diagnosis Code(s):        --- Professional ---  D12.2, Benign neoplasm of ascending colon                           K62.89, Other specified diseases of anus and rectum                           K64.8, Other hemorrhoids                           K92.2, Gastrointestinal hemorrhage, unspecified                           K57.30, Diverticulosis of large intestine without                            perforation or abscess without bleeding CPT copyright 2018 [employer]. All rights reserved. The codes documented in this report are preliminary and upon coder review may  be revised to meet current compliance requirements. Mauri Pole, MD 08/11/2018 1:28:51 PM This report has been signed electronically. Number of Addenda: 0

## 2018-09-21 ENCOUNTER — Ambulatory Visit: Payer: 59 | Admitting: Gastroenterology

## 2019-01-25 DIAGNOSIS — L0889 Other specified local infections of the skin and subcutaneous tissue: Secondary | ICD-10-CM | POA: Diagnosis not present

## 2019-01-25 DIAGNOSIS — K130 Diseases of lips: Secondary | ICD-10-CM | POA: Diagnosis not present

## 2019-02-12 DIAGNOSIS — Z1231 Encounter for screening mammogram for malignant neoplasm of breast: Secondary | ICD-10-CM | POA: Diagnosis not present

## 2019-04-26 DIAGNOSIS — D485 Neoplasm of uncertain behavior of skin: Secondary | ICD-10-CM | POA: Diagnosis not present

## 2019-04-26 DIAGNOSIS — L570 Actinic keratosis: Secondary | ICD-10-CM | POA: Diagnosis not present

## 2019-04-26 DIAGNOSIS — D1801 Hemangioma of skin and subcutaneous tissue: Secondary | ICD-10-CM | POA: Diagnosis not present

## 2019-04-26 DIAGNOSIS — D2262 Melanocytic nevi of left upper limb, including shoulder: Secondary | ICD-10-CM | POA: Diagnosis not present

## 2019-04-26 DIAGNOSIS — D225 Melanocytic nevi of trunk: Secondary | ICD-10-CM | POA: Diagnosis not present

## 2019-05-08 DIAGNOSIS — D485 Neoplasm of uncertain behavior of skin: Secondary | ICD-10-CM | POA: Diagnosis not present

## 2019-05-08 DIAGNOSIS — D2262 Melanocytic nevi of left upper limb, including shoulder: Secondary | ICD-10-CM | POA: Diagnosis not present

## 2019-06-03 ENCOUNTER — Ambulatory Visit: Payer: BC Managed Care – PPO

## 2019-06-03 ENCOUNTER — Ambulatory Visit
Admission: EM | Admit: 2019-06-03 | Discharge: 2019-06-03 | Disposition: A | Discharge status: Home / Self Care | Payer: BC Managed Care – PPO | Attending: Physician Assistant | Admitting: Physician Assistant

## 2019-06-03 ENCOUNTER — Other Ambulatory Visit: Payer: Self-pay

## 2019-06-03 ENCOUNTER — Ambulatory Visit (INDEPENDENT_AMBULATORY_CARE_PROVIDER_SITE_OTHER): Payer: BC Managed Care – PPO

## 2019-06-03 DIAGNOSIS — S99912A Unspecified injury of left ankle, initial encounter: Secondary | ICD-10-CM | POA: Diagnosis not present

## 2019-06-03 DIAGNOSIS — M25572 Pain in left ankle and joints of left foot: Secondary | ICD-10-CM | POA: Diagnosis not present

## 2019-06-03 MED ORDER — TRAMADOL HCL 50 MG PO TABS: 50.0000 mg | ORAL_TABLET | Freq: Four times a day (QID) | ORAL | 0 refills | Status: DC | PRN

## 2019-06-03 NOTE — ED Triage Notes (Signed)
Per pt she was at church today and had wedges on and fell on stage and trolled her left ankle. No obvious deformity. Some swelling.

## 2019-06-03 NOTE — ED Provider Notes (Signed)
[facility]    CSN: [id number] Arrival date & time: 06/03/19  1344      History   Chief Complaint Chief Complaint  Patient presents with   Ankle Pain    HPI Kayliegh Jerzak is a 58 y.o. female.   58 year old female comes in for left ankle pain after injury. She was in heels when she twisted her ankle and fell. Unsure if inverted or everted ankle. Her pain is to the lateral ankle/foot with pain at rest, worse with weightbearing and ROM. She notices swelling without contusion, erythema. Has done ice with minimal relief.      Past Medical History:  Diagnosis Date   Allergy    Fatty liver    Gallstones     Patient Active Problem List   Diagnosis Date Noted   BRBPR (bright red blood per rectum)    Morbid obesity with body mass index (BMI) greater than or equal to 50 [facility])    Rectal pain     Past Surgical History:  Procedure Laterality Date   BARIATRIC SURGERY     BIOPSY  08/11/2018   Procedure: BIOPSY;  Surgeon: Mauri Pole, MD;  Location: [facility];  Service: Endoscopy;;   COLONOSCOPY WITH PROPOFOL [name] 08/11/2018   Procedure: COLONOSCOPY WITH PROPOFOL;  Surgeon: Mauri Pole, MD;  Location: [facility];  Service: Endoscopy;  Laterality: [name];   DILATION AND CURETTAGE OF UTERUS     ENDOMETRIAL ABLATION     GALLBLADDER SURGERY     GASTRIC BYPASS     POLYPECTOMY  08/11/2018   Procedure: POLYPECTOMY;  Surgeon: Mauri Pole, MD;  Location: [facility];  Service: Endoscopy;;    OB History   No obstetric history on file.      Home Medications    Prior to Admission medications   Medication Sig Start Date End Date Taking? Authorizing Provider  acetaminophen (TYLENOL) 500 MG tablet Take 1,000 mg by mouth daily as needed for moderate pain or headache.    [provider]  AMBULATORY NON FORMULARY MEDICATION Medication Name: 0.125% Nitroglycerin ointment  Use Pea sized amount tectally three times a day with the  Recticare 08/02/18   Nandigam, Venia Minks, MD  BIOTIN PO Take 1 tablet by mouth daily.    [provider]  Cyanocobalamin (B-12 PO) Take 1 tablet by mouth daily.    [provider]  diphenhydrAMINE HCl (ZZZQUIL) 50 MG/30ML LIQD Take 50 mg by mouth at bedtime as needed (sleep).    [provider]  fexofenadine (ALLEGRA ALLERGY) 180 MG tablet Take 180 mg by mouth daily.     [provider]  FOLIC ACID PO Take 1 tablet by mouth daily.    [provider]  Multiple Vitamins-Minerals (ADULT GUMMY) CHEW Chew 4 tablets by mouth daily.    [provider]  omeprazole (PRILOSEC OTC) 20 MG tablet Take 20 mg by mouth daily.    [provider]  OVER THE COUNTER MEDICATION Take 1 capsule by mouth daily. diurex otc supplement    [provider]  OVER THE COUNTER MEDICATION Take 2 tablets by mouth daily. Amberen otc supplement    [provider]  traMADol (ULTRAM) 50 MG tablet Take 1 tablet (50 mg total) by mouth every 6 (six) hours as needed. 06/03/19   Ok Edwards, PA-C    Family History Family History  Problem Relation Age of Onset   Lung cancer Mother    Diabetes Father  Heart disease Father     Social History Social History   Tobacco Use   Smoking status: Never Smoker   Smokeless tobacco: Never Used  Substance Use Topics   Alcohol use: No    Frequency: Never   Drug use: No     Allergies   Diflucan [fluconazole], Latex, and Tape   Review of Systems Review of Systems  Reason unable to perform ROS: See HPI as above.     Physical Exam Triage Vital Signs ED Triage Vitals [06/03/19 1354]  Enc Vitals Group     BP (!) 166/93     Pulse Rate 78     Resp 16     Temp 97.6 F (36.4 C)     Temp Source Oral     SpO2 97 %     Weight      Height      Head Circumference      Peak Flow      Pain Score 7     Pain Loc      Pain Edu?      Excl. in [place]?    No data found.  Updated Vital Signs BP (!)  166/93 (BP Location: Left Wrist)    Pulse 78    Temp 97.6 F (36.4 C) (Oral)    Resp 16    SpO2 97%   Physical Exam Constitutional:      General: She is not in acute distress.    Appearance: She is well-developed. She is not diaphoretic.  HENT:     Head: Normocephalic and atraumatic.  Eyes:     Conjunctiva/sclera: Conjunctivae normal.     Pupils: Pupils are equal, round, and reactive to light.  Pulmonary:     Effort: Pulmonary effort is normal. No respiratory distress.  Musculoskeletal:     Comments: Swelling to the left lateral ankle. Tenderness to palpation along lateral malleolus. No tenderness to palpation of dorsal aspect of ankle/foot. States dorsal aspect of foot with decreased sensation. Full ROM of ankle. Pedal pulse 2+  Neurological:     Mental Status: She is alert and oriented to person, place, and time.     UC Treatments / Results  Labs (all labs ordered are listed, but only abnormal results are displayed) Labs Reviewed - No data to display  EKG   Radiology Dg Ankle Complete Left  Result Date: 06/03/2019 CLINICAL DATA:  LEFT ankle pain after falling at church today while wearing wedge heels EXAM: LEFT ANKLE COMPLETE - 3+ VIEW COMPARISON:  None FINDINGS: Osseous mineralization normal. Joint spaces preserved, with spurring noted at naviculocuneiform joint dorsally. Soft tissue swelling at ankle, especially laterally and anteriorly. Non fused ossicle at medial joint line, corticated and old. Calcific density identified at dorsal margin of distal talus, question age-indeterminate fracture. No additional fracture, dislocation, or bone destruction. Large plantar calcaneal spur. IMPRESSION: Questionable age-indeterminate fracture fragment at dorsal margin of distal talus; recommend correlation for pain/tenderness at this site. No other focal osseous abnormalities. Electronically Signed   By: Lavonia Dana M.D.   On: 06/03/2019 14:14    Procedures Procedures (including critical  care time)  Medications Ordered in UC Medications - No data to display  Initial Impression / Assessment and Plan / UC Course  I have reviewed the triage vital signs and the nursing notes.  Pertinent labs & imaging results that were available during my care of the patient were reviewed by me and considered in my medical decision making (see chart for details).  Discussed xray results with patient. No obvious tenderness to palpation of distal talus. However, given painful weightbearing with pain at rest, will provide cam walker. Patient with crutches and knee scooter available at home. Patient unable to take NSAIDs due to h/o gastric bypass. Will have patient start tylenol/voltaren gel. Tramadol as needed for further pain relief. Return precautions given. Patient expresses understanding and agrees to plan.  Final Clinical Impressions(s) / UC Diagnoses   Final diagnoses:  Acute left ankle pain    ED Prescriptions    Medication Sig Dispense Auth. Provider   traMADol (ULTRAM) 50 MG tablet Take 1 tablet (50 mg total) by mouth every 6 (six) hours as needed. 15 tablet Ok Edwards, PA-C     Controlled Substance Prescriptions [place] Controlled Substance Registry consulted? Yes, I have consulted the [place] Controlled Substances Registry for this patient, and feel the risk/benefit ratio today is favorable for proceeding with this prescription for a controlled substance.   Ok Edwards, PA-C 06/03/19 1454

## 2019-06-03 NOTE — Discharge Instructions (Signed)
As discussed, questionable fracture to the top of your ankle/foot. Start tylenol, voltaren gel, ice compress for pain. Tramadol for breakthrough pain. Follow up with orthopedics for further evaluation and management needed if symptoms not improving.

## 2019-06-27 DIAGNOSIS — R51 Headache: Secondary | ICD-10-CM | POA: Diagnosis not present

## 2019-06-27 DIAGNOSIS — Z20828 Contact with and (suspected) exposure to other viral communicable diseases: Secondary | ICD-10-CM | POA: Diagnosis not present

## 2019-08-08 ENCOUNTER — Emergency Department (HOSPITAL_BASED_OUTPATIENT_CLINIC_OR_DEPARTMENT_OTHER): Payer: BC Managed Care – PPO

## 2019-08-08 ENCOUNTER — Encounter (HOSPITAL_BASED_OUTPATIENT_CLINIC_OR_DEPARTMENT_OTHER): Payer: Self-pay | Admitting: Emergency Medicine

## 2019-08-08 ENCOUNTER — Emergency Department (HOSPITAL_BASED_OUTPATIENT_CLINIC_OR_DEPARTMENT_OTHER)
Admission: EM | Admit: 2019-08-08 | Discharge: 2019-08-08 | Disposition: A | Discharge status: Home / Self Care | Payer: BC Managed Care – PPO | Attending: Emergency Medicine | Admitting: Emergency Medicine

## 2019-08-08 ENCOUNTER — Other Ambulatory Visit: Payer: Self-pay

## 2019-08-08 DIAGNOSIS — S8002XA Contusion of left knee, initial encounter: Secondary | ICD-10-CM

## 2019-08-08 DIAGNOSIS — S52591A Other fractures of lower end of right radius, initial encounter for closed fracture: Secondary | ICD-10-CM | POA: Diagnosis not present

## 2019-08-08 DIAGNOSIS — S6991XA Unspecified injury of right wrist, hand and finger(s), initial encounter: Secondary | ICD-10-CM | POA: Diagnosis not present

## 2019-08-08 DIAGNOSIS — Y9301 Activity, walking, marching and hiking: Secondary | ICD-10-CM | POA: Diagnosis not present

## 2019-08-08 DIAGNOSIS — Z91040 Latex allergy status: Secondary | ICD-10-CM | POA: Diagnosis not present

## 2019-08-08 DIAGNOSIS — Z888 Allergy status to other drugs, medicaments and biological substances status: Secondary | ICD-10-CM | POA: Insufficient documentation

## 2019-08-08 DIAGNOSIS — Z91048 Other nonmedicinal substance allergy status: Secondary | ICD-10-CM | POA: Diagnosis not present

## 2019-08-08 DIAGNOSIS — Y92008 Other place in unspecified non-institutional (private) residence as the place of occurrence of the external cause: Secondary | ICD-10-CM | POA: Diagnosis not present

## 2019-08-08 DIAGNOSIS — S8992XA Unspecified injury of left lower leg, initial encounter: Secondary | ICD-10-CM | POA: Diagnosis not present

## 2019-08-08 DIAGNOSIS — W010XXA Fall on same level from slipping, tripping and stumbling without subsequent striking against object, initial encounter: Secondary | ICD-10-CM | POA: Diagnosis not present

## 2019-08-08 DIAGNOSIS — S52501A Unspecified fracture of the lower end of right radius, initial encounter for closed fracture: Secondary | ICD-10-CM

## 2019-08-08 DIAGNOSIS — S52601A Unspecified fracture of lower end of right ulna, initial encounter for closed fracture: Secondary | ICD-10-CM

## 2019-08-08 DIAGNOSIS — M25562 Pain in left knee: Secondary | ICD-10-CM | POA: Diagnosis not present

## 2019-08-08 DIAGNOSIS — Z79899 Other long term (current) drug therapy: Secondary | ICD-10-CM | POA: Insufficient documentation

## 2019-08-08 DIAGNOSIS — Y999 Unspecified external cause status: Secondary | ICD-10-CM | POA: Insufficient documentation

## 2019-08-08 DIAGNOSIS — S52611A Displaced fracture of right ulna styloid process, initial encounter for closed fracture: Secondary | ICD-10-CM | POA: Diagnosis not present

## 2019-08-08 NOTE — ED Provider Notes (Signed)
[place] [facility] DEPARTMENT Provider Note   CSN: [id number] Arrival date & time: 08/08/19  0756     History   Chief Complaint Chief Complaint  Patient presents with  . Fall  . Knee Pain  . Hand Pain    HPI Brandy Lewis is a 58 y.o. female.  She had a mechanical fall at home this morning.  She said she was in the garage getting up and and tripped over something falling striking her right hand and left knee.  She denies any head or neck or back pain.  No loss of consciousness.  Took some Aleve with minimal improvement.  No numbness or weakness.  No chest pain or abdominal pain.  Pain is to dorsum of right hand and wrist.  Throbbing in nature 7 out of 10.  Pain left knee over patella.  Throbbing 7 out of 10.     The history is provided by the patient.  Fall This is a new problem. The current episode started 1 to 2 hours ago. The problem occurs constantly. The problem has not changed since onset.Pertinent negatives include no chest pain, no abdominal pain, no headaches and no shortness of breath. The symptoms are aggravated by twisting, bending, standing and walking. The symptoms are relieved by position. She has tried rest for the symptoms. The treatment provided mild relief.    Past Medical History:  Diagnosis Date  . Allergy   . Fatty liver   . Gallstones     Patient Active Problem List   Diagnosis Date Noted  . BRBPR (bright red blood per rectum)   . Morbid obesity with body mass index (BMI) greater than or equal to 50 (HCC)   . Rectal pain     Past Surgical History:  Procedure Laterality Date  . BARIATRIC SURGERY    . BIOPSY  08/11/2018   Procedure: BIOPSY;  Surgeon: Mauri Pole, MD;  Location: [facility];  Service: Endoscopy;;  . COLONOSCOPY WITH PROPOFOL [name] 08/11/2018   Procedure: COLONOSCOPY WITH PROPOFOL;  Surgeon: Mauri Pole, MD;  Location: [facility];  Service: Endoscopy;  Laterality: [name];  . DILATION AND CURETTAGE OF  UTERUS    . ENDOMETRIAL ABLATION    . GALLBLADDER SURGERY    . GASTRIC BYPASS    . POLYPECTOMY  08/11/2018   Procedure: POLYPECTOMY;  Surgeon: Mauri Pole, MD;  Location: [facility];  Service: Endoscopy;;     OB History   No obstetric history on file.      Home Medications    Prior to Admission medications   Medication Sig Start Date End Date Taking? Authorizing Provider  acetaminophen (TYLENOL) 500 MG tablet Take 1,000 mg by mouth daily as needed for moderate pain or headache.    [provider]  AMBULATORY NON FORMULARY MEDICATION Medication Name: 0.125% Nitroglycerin ointment  Use Pea sized amount tectally three times a day with the Recticare 08/02/18   Nandigam, Venia Minks, MD  BIOTIN PO Take 1 tablet by mouth daily.    [provider]  Cyanocobalamin (B-12 PO) Take 1 tablet by mouth daily.    [provider]  diphenhydrAMINE HCl (ZZZQUIL) 50 MG/30ML LIQD Take 50 mg by mouth at bedtime as needed (sleep).    [provider]  fexofenadine (ALLEGRA ALLERGY) 180 MG tablet Take 180 mg by mouth daily.     [provider]  FOLIC ACID PO Take 1 tablet by mouth daily.    [provider]  Multiple  Vitamins-Minerals (ADULT GUMMY) CHEW Chew 4 tablets by mouth daily.    [provider]  omeprazole (PRILOSEC OTC) 20 MG tablet Take 20 mg by mouth daily.    [provider]  OVER THE COUNTER MEDICATION Take 1 capsule by mouth daily. diurex otc supplement    [provider]  OVER THE COUNTER MEDICATION Take 2 tablets by mouth daily. Amberen otc supplement    [provider]  traMADol (ULTRAM) 50 MG tablet Take 1 tablet (50 mg total) by mouth every 6 (six) hours as needed. 06/03/19   Ok Edwards, PA-C    Family History Family History  Problem Relation Age of Onset  . Lung cancer Mother   . Diabetes Father   . Heart disease Father     Social History Social History   Tobacco Use  . Smoking status:  Never Smoker  . Smokeless tobacco: Never Used  Substance Use Topics  . Alcohol use: No    Frequency: Never  . Drug use: No     Allergies   Diflucan [fluconazole], Latex, and Tape   Review of Systems Review of Systems  Constitutional: Negative for fever.  HENT: Negative for sore throat.   Eyes: Negative for visual disturbance.  Respiratory: Negative for shortness of breath.   Cardiovascular: Negative for chest pain.  Gastrointestinal: Negative for abdominal pain.  Genitourinary: Negative for hematuria.  Musculoskeletal: Negative for back pain and neck pain.  Skin: Positive for wound.  Neurological: Negative for headaches.     Physical Exam Updated Vital Signs BP (!) 148/97 (BP Location: Left Arm)   Pulse 69   Temp 97.9 F (36.6 C) (Oral)   Ht 5' 2.5" (1.588 m)   Wt 117.9 kg   SpO2 97%   BMI 46.80 kg/m   Physical Exam Constitutional:      Appearance: She is well-developed.  HENT:     Head: Normocephalic and atraumatic.  Eyes:     Conjunctiva/sclera: Conjunctivae normal.  Neck:     Musculoskeletal: Neck supple.  Cardiovascular:     Rate and Rhythm: Normal rate and regular rhythm.     Pulses: Normal pulses.  Pulmonary:     Effort: Pulmonary effort is normal.     Breath sounds: No wheezing.  Abdominal:     Tenderness: There is no abdominal tenderness. There is no guarding.  Musculoskeletal:        General: Tenderness and signs of injury present.     Comments: She has some swelling over the dorsum of hand and wrist.  Diffuse tenderness.  Small abrasion in the palm.  Digits nontender.  Cap refill and motor intact.  Radial pulse 2+.  Shoulder elbow nontender.  Left lower extremity some abrasion over her patella.  No joint line tenderness.  No ligamentous laxity.  Patella is tender.  Other extremities full range of motion without any pain or limitation.  Skin:    General: Skin is warm and dry.     Capillary Refill: Capillary refill takes less than 2 seconds.   Neurological:     General: No focal deficit present.     Mental Status: She is alert.     GCS: GCS eye subscore is 4. GCS verbal subscore is 5. GCS motor subscore is 6.     Sensory: No sensory deficit.     Motor: No weakness.      ED Treatments / Results  Labs (all labs ordered are listed, but only abnormal results are displayed) Labs Reviewed -  No data to display  EKG None  Radiology Dg Wrist Complete Right  Result Date: 08/08/2019 CLINICAL DATA:  Fall, pain EXAM: RIGHT WRIST - COMPLETE 3+ VIEW; RIGHT HAND - COMPLETE 3+ VIEW COMPARISON:  None. FINDINGS: There is a minimally impacted and angulated fracture of the distal right radial metadiaphysis with an additional minimally displaced fracture of the right ulnar styloid. No other fracture or dislocation of the right hand or right wrist. Joint spaces are well preserved. Soft tissue edema about the wrist. IMPRESSION: 1. There is a minimally impacted and angulated fracture of the distal right radial metadiaphysis with an additional minimally displaced fracture of the right ulnar styloid. No other fracture or dislocation of the right hand or right wrist. 2.  Soft tissue edema about the wrist. Electronically Signed   By: Eddie Candle M.D.   On: 08/08/2019 08:42   Dg Knee Complete 4 Views Left  Result Date: 08/08/2019 CLINICAL DATA:  Fall, pain EXAM: LEFT KNEE - COMPLETE 4+ VIEW COMPARISON:  None. FINDINGS: No fracture or dislocation of the left knee. There is mild medial compartment joint space narrowing and osteophytosis. No knee joint effusion. Soft tissues are unremarkable. IMPRESSION: No fracture or dislocation of the left knee. Electronically Signed   By: Eddie Candle M.D.   On: 08/08/2019 08:43   Dg Hand Complete Right  Result Date: 08/08/2019 CLINICAL DATA:  Fall, pain EXAM: RIGHT WRIST - COMPLETE 3+ VIEW; RIGHT HAND - COMPLETE 3+ VIEW COMPARISON:  None. FINDINGS: There is a minimally impacted and angulated fracture of the distal  right radial metadiaphysis with an additional minimally displaced fracture of the right ulnar styloid. No other fracture or dislocation of the right hand or right wrist. Joint spaces are well preserved. Soft tissue edema about the wrist. IMPRESSION: 1. There is a minimally impacted and angulated fracture of the distal right radial metadiaphysis with an additional minimally displaced fracture of the right ulnar styloid. No other fracture or dislocation of the right hand or right wrist. 2.  Soft tissue edema about the wrist. Electronically Signed   By: Eddie Candle M.D.   On: 08/08/2019 08:42    Procedures Procedures (including critical care time)  Medications Ordered in ED Medications - No data to display   Initial Impression / Assessment and Plan / ED Course  I have reviewed the triage vital signs and the nursing notes.  Pertinent labs & imaging results that were available during my care of the patient were reviewed by me and considered in my medical decision making (see chart for details).  Clinical Course as of Aug 07 1705  Wed Aug 08, 2019  0859 Patient here with mechanical fall complaining of right wrist hand and left knee pain.  Differential includes fracture, dislocation, sprain, contusion.  X-rays interpreted by me as distal radius and ulnar styloid fracture.  Hand consult placed.  Patient updated on results.   [MB]  0919 Discussed with Dr. Apolonio Schneiders hand surgery on-call.  He recommends splinting the patient and have her follow-up in the office on Friday.   [MB]  [phone] Patient works in home health care and she reached out to Dr. Vanetta Shawl office and is actually in a follow-up with him.   [MB]  [id number] Patient splinted by tech.  Normal CSMs after application.   [MB]    Clinical Course User Index [MB] Hayden Rasmussen, MD        Final Clinical Impressions(s) / ED Diagnoses   Final diagnoses:  Closed fracture  of distal ends of right radius and ulna, initial encounter  Contusion of  left knee, initial encounter    ED Discharge Orders    None       Hayden Rasmussen, MD 08/08/19 [phone]

## 2019-08-08 NOTE — ED Triage Notes (Signed)
Pt here after mechanical fall with pain to right hand and left knee. Took alieve this am with minimal relief.

## 2019-08-08 NOTE — Discharge Instructions (Signed)
You were seen in the emergency department for evaluation of injuries from a fall.  You have a distal radius and ulnar styloid fracture.  This was treated with a splint and you will need to keep this on clean and dry until you follow-up with orthopedic hand.  Your knee x-ray did not show any obvious fracture.  Ice, Tylenol and ibuprofen.  Return if any numbness weakness or other concerning symptoms.

## 2019-08-09 DIAGNOSIS — M25531 Pain in right wrist: Secondary | ICD-10-CM | POA: Diagnosis not present

## 2019-08-09 DIAGNOSIS — S52501A Unspecified fracture of the lower end of right radius, initial encounter for closed fracture: Secondary | ICD-10-CM | POA: Diagnosis not present

## 2019-08-20 DIAGNOSIS — S52501D Unspecified fracture of the lower end of right radius, subsequent encounter for closed fracture with routine healing: Secondary | ICD-10-CM | POA: Diagnosis not present

## 2019-08-20 DIAGNOSIS — M25531 Pain in right wrist: Secondary | ICD-10-CM | POA: Diagnosis not present

## 2019-09-13 DIAGNOSIS — M25531 Pain in right wrist: Secondary | ICD-10-CM | POA: Diagnosis not present

## 2019-09-13 DIAGNOSIS — S52501D Unspecified fracture of the lower end of right radius, subsequent encounter for closed fracture with routine healing: Secondary | ICD-10-CM | POA: Diagnosis not present

## 2019-10-11 DIAGNOSIS — M65312 Trigger thumb, left thumb: Secondary | ICD-10-CM | POA: Diagnosis not present

## 2019-10-11 DIAGNOSIS — S52501D Unspecified fracture of the lower end of right radius, subsequent encounter for closed fracture with routine healing: Secondary | ICD-10-CM | POA: Diagnosis not present

## 2019-11-27 DIAGNOSIS — D225 Melanocytic nevi of trunk: Secondary | ICD-10-CM | POA: Diagnosis not present

## 2019-11-27 DIAGNOSIS — L821 Other seborrheic keratosis: Secondary | ICD-10-CM | POA: Diagnosis not present

## 2019-11-27 DIAGNOSIS — D2262 Melanocytic nevi of left upper limb, including shoulder: Secondary | ICD-10-CM | POA: Diagnosis not present

## 2019-11-27 DIAGNOSIS — L814 Other melanin hyperpigmentation: Secondary | ICD-10-CM | POA: Diagnosis not present

## 2019-12-06 ENCOUNTER — Emergency Department (HOSPITAL_COMMUNITY)
Admission: EM | Admit: 2019-12-06 | Discharge: 2019-12-06 | Disposition: A | Discharge status: Home / Self Care | Payer: BC Managed Care – PPO | Attending: Emergency Medicine | Admitting: Emergency Medicine

## 2019-12-06 ENCOUNTER — Emergency Department (HOSPITAL_COMMUNITY): Payer: BC Managed Care – PPO

## 2019-12-06 ENCOUNTER — Other Ambulatory Visit: Payer: Self-pay

## 2019-12-06 ENCOUNTER — Encounter (HOSPITAL_COMMUNITY): Payer: Self-pay

## 2019-12-06 DIAGNOSIS — Z91040 Latex allergy status: Secondary | ICD-10-CM | POA: Diagnosis not present

## 2019-12-06 DIAGNOSIS — Z79899 Other long term (current) drug therapy: Secondary | ICD-10-CM | POA: Insufficient documentation

## 2019-12-06 DIAGNOSIS — R1031 Right lower quadrant pain: Secondary | ICD-10-CM | POA: Diagnosis not present

## 2019-12-06 DIAGNOSIS — Z9884 Bariatric surgery status: Secondary | ICD-10-CM | POA: Diagnosis not present

## 2019-12-06 DIAGNOSIS — Z9049 Acquired absence of other specified parts of digestive tract: Secondary | ICD-10-CM | POA: Diagnosis not present

## 2019-12-06 DIAGNOSIS — R109 Unspecified abdominal pain: Secondary | ICD-10-CM | POA: Diagnosis not present

## 2019-12-06 LAB — URINALYSIS, ROUTINE W REFLEX MICROSCOPIC
Bilirubin Urine: NEGATIVE
Glucose, UA: NEGATIVE mg/dL
Hgb urine dipstick: NEGATIVE
Ketones, ur: NEGATIVE mg/dL
Leukocytes,Ua: NEGATIVE
Nitrite: NEGATIVE
Protein, ur: NEGATIVE mg/dL
Specific Gravity, Urine: 1.031 — ABNORMAL HIGH (ref 1.005–1.030)
pH: 5 (ref 5.0–8.0)

## 2019-12-06 LAB — BASIC METABOLIC PANEL
Anion gap: 9 (ref 5–15)
BUN: 21 mg/dL — ABNORMAL HIGH (ref 6–20)
CO2: 22 mmol/L (ref 22–32)
Calcium: 9.1 mg/dL (ref 8.9–10.3)
Chloride: 110 mmol/L (ref 98–111)
Creatinine, Ser: 0.87 mg/dL (ref 0.44–1.00)
GFR calc Af Amer: >60 mL/min (ref >60)
GFR calc non Af Amer: >60 mL/min (ref >60)
Glucose, Bld: 114 mg/dL — ABNORMAL HIGH (ref 70–99)
Potassium: 4.2 mmol/L (ref 3.5–5.1)
Sodium: 141 mmol/L (ref 135–145)

## 2019-12-06 LAB — CBC
HCT: 43.8 % (ref 36.0–46.0)
Hemoglobin: 14.1 g/dL (ref 12.0–15.0)
MCH: 27.2 pg (ref 26.0–34.0)
MCHC: 32.2 g/dL (ref 30.0–36.0)
MCV: 84.4 fL (ref 80.0–100.0)
Platelets: 303 10*3/uL (ref 150–400)
RBC: 5.19 MIL/uL — ABNORMAL HIGH (ref 3.87–5.11)
RDW: 12.8 % (ref 11.5–15.5)
WBC: 7.7 10*3/uL (ref 4.0–10.5)
nRBC: 0 % (ref 0.0–0.2)

## 2019-12-06 LAB — I-STAT BETA HCG BLOOD, ED (MC, WL, AP ONLY): I-stat hCG, quantitative: <5 m[IU]/mL (ref <5)

## 2019-12-06 MED ORDER — MORPHINE SULFATE (PF) 4 MG/ML IV SOLN
4.0000 mg | Freq: Once | INTRAVENOUS | Status: AC
  Administered 2019-12-06: 4 mg via INTRAVENOUS
  Filled 2019-12-06: qty 1

## 2019-12-06 MED ORDER — KETOROLAC TROMETHAMINE 30 MG/ML IJ SOLN
30.0000 mg | Freq: Once | INTRAMUSCULAR | Status: AC
  Administered 2019-12-06: 30 mg via INTRAVENOUS
  Filled 2019-12-06: qty 1

## 2019-12-06 MED ORDER — SODIUM CHLORIDE 0.9 % IV SOLN: INTRAVENOUS | Status: DC

## 2019-12-06 MED ORDER — SODIUM CHLORIDE 0.9 % IV BOLUS
1000.0000 mL | Freq: Once | INTRAVENOUS | Status: AC
  Administered 2019-12-06: 1000 mL via INTRAVENOUS

## 2019-12-06 MED ORDER — ONDANSETRON HCL 4 MG/2ML IJ SOLN
4.0000 mg | Freq: Once | INTRAMUSCULAR | Status: AC
  Administered 2019-12-06: 4 mg via INTRAVENOUS
  Filled 2019-12-06: qty 2

## 2019-12-06 MED ORDER — METHOCARBAMOL 500 MG PO TABS: 500.0000 mg | ORAL_TABLET | Freq: Three times a day (TID) | ORAL | 0 refills | Status: DC | PRN

## 2019-12-06 NOTE — ED Provider Notes (Signed)
Brandy Lewis Provider Note   CSN: [id number] Arrival date & time: 12/06/19  1241     History Chief Complaint  Patient presents with  . Flank Pain    Brandy Lewis is a 59 y.o. female.  Pt presents to the ED today with right sided flank pain.  Pt said sx started yesterday, but it was worse today.  No hx of kidney stones, but she drinks a lot of tea.        Past Medical History:  Diagnosis Date  . Allergy   . Fatty liver   . Gallstones     Patient Active Problem List   Diagnosis Date Noted  . BRBPR (bright red blood per rectum)   . Morbid obesity with body mass index (BMI) greater than or equal to 50 (HCC)   . Rectal pain     Past Surgical History:  Procedure Laterality Date  . BARIATRIC SURGERY    . BIOPSY  08/11/2018   Procedure: BIOPSY;  Surgeon: Mauri Pole, MD;  Location: [facility];  Service: Endoscopy;;  . CHOLECYSTECTOMY    . COLONOSCOPY WITH PROPOFOL [name] 08/11/2018   Procedure: COLONOSCOPY WITH PROPOFOL;  Surgeon: Mauri Pole, MD;  Location: [facility];  Service: Endoscopy;  Laterality: [name];  . DILATION AND CURETTAGE OF UTERUS    . ENDOMETRIAL ABLATION    . GALLBLADDER SURGERY    . GASTRIC BYPASS    . POLYPECTOMY  08/11/2018   Procedure: POLYPECTOMY;  Surgeon: Mauri Pole, MD;  Location: [facility];  Service: Endoscopy;;     OB History   No obstetric history on file.     Family History  Problem Relation Age of Onset  . Lung cancer Mother   . Diabetes Father   . Heart disease Father     Social History   Tobacco Use  . Smoking status: Never Smoker  . Smokeless tobacco: Never Used  Substance Use Topics  . Alcohol use: No  . Drug use: No    Home Medications Prior to Admission medications   Medication Sig Start Date End Date Taking? Authorizing Provider  acetaminophen (TYLENOL) 500 MG tablet Take 1,000 mg by mouth daily as needed for moderate pain or headache.   Yes  [provider]  BIOTIN PO Take 1 tablet by mouth daily.   Yes [provider]  Cyanocobalamin (B-12 PO) Take 1 tablet by mouth daily.   Yes [provider]  fexofenadine (ALLEGRA ALLERGY) 180 MG tablet Take 180 mg by mouth daily.    Yes [provider]  FOLIC ACID PO Take 1 tablet by mouth daily.   Yes [provider]  Multiple Vitamins-Minerals (ADULT GUMMY) CHEW Chew 4 tablets by mouth daily.   Yes [provider]  naproxen sodium (ALEVE) 220 MG tablet Take 220 mg by mouth 2 (two) times daily as needed (pain/headache).   Yes [provider]  omeprazole (PRILOSEC OTC) 20 MG tablet Take 20 mg by mouth daily.   Yes [provider]  OVER THE COUNTER MEDICATION Take 1 capsule by mouth daily. diurex otc supplement   Yes [provider]  polyvinyl alcohol (LIQUIFILM TEARS) 1.4 % ophthalmic solution Place 1 drop into both eyes as needed for dry eyes.   Yes [provider]  AMBULATORY NON FORMULARY MEDICATION Medication Name: 0.125% Nitroglycerin ointment  Use Pea sized amount tectally three times a day with the Recticare Patient not taking: Reported on 12/06/2019 08/02/18   Nandigam,  Venia Minks, MD  methocarbamol (ROBAXIN) 500 MG tablet Take 1 tablet (500 mg total) by mouth every 8 (eight) hours as needed for muscle spasms. 12/06/19   Maudie Flakes, MD  traMADol (ULTRAM) 50 MG tablet Take 1 tablet (50 mg total) by mouth every 6 (six) hours as needed. Patient not taking: Reported on 12/06/2019 06/03/19   Ok Edwards, PA-C    Allergies    Diflucan [fluconazole], Latex, and Tape  Review of Systems   Review of Systems  Genitourinary: Positive for flank pain.    Physical Exam Updated Vital Signs BP (!) 161/90   Pulse 73   Temp 97.7 F (36.5 C) (Oral)   Resp 17   Ht 5\' 2"  (1.575 m)   Wt 122.5 kg   SpO2 98%   BMI 49.38 kg/m   Physical Exam Vitals and nursing note reviewed.  Constitutional:      Appearance:  Normal appearance.  HENT:     Head: Normocephalic and atraumatic.     Right Ear: External ear normal.     Left Ear: External ear normal.     Nose: Nose normal.     Mouth/Throat:     Mouth: Mucous membranes are moist.     Pharynx: Oropharynx is clear.  Eyes:     Extraocular Movements: Extraocular movements intact.     Conjunctiva/sclera: Conjunctivae normal.     Pupils: Pupils are equal, round, and reactive to light.  Cardiovascular:     Rate and Rhythm: Normal rate and regular rhythm.     Pulses: Normal pulses.     Heart sounds: Normal heart sounds.  Pulmonary:     Effort: Pulmonary effort is normal.     Breath sounds: Normal breath sounds.  Abdominal:     General: Abdomen is flat. Bowel sounds are normal.     Palpations: Abdomen is soft.  Musculoskeletal:        General: Normal range of motion.     Cervical back: Normal range of motion and neck supple.  Skin:    General: Skin is warm.     Capillary Refill: Capillary refill takes less than 2 seconds.  Neurological:     General: No focal deficit present.     Mental Status: She is alert and oriented to person, place, and time.  Psychiatric:        Mood and Affect: Mood normal.        Behavior: Behavior normal.        Thought Content: Thought content normal.        Judgment: Judgment normal.     ED Results / Procedures / Treatments   Labs (all labs ordered are listed, but only abnormal results are displayed) Labs Reviewed  URINALYSIS, ROUTINE W REFLEX MICROSCOPIC - Abnormal; Notable for the following components:      Result Value   Specific Gravity, Urine 1.031 (*)    All other components within normal limits  BASIC METABOLIC PANEL - Abnormal; Notable for the following components:   Glucose, Bld 114 (*)    BUN 21 (*)    All other components within normal limits  CBC - Abnormal; Notable for the following components:   RBC 5.19 (*)    All other components within normal limits  I-STAT BETA HCG BLOOD, ED ([facility], [facility], [facility]  ONLY)    EKG EKG Interpretation  Date/Time:  Thursday December 06 2019 16:37:38 EST Ventricular Rate:  75 PR Interval:    QRS Duration: 152 QT Interval:  430 QTC Calculation: 481 R Axis:   48 Text Interpretation: Sinus rhythm Right bundle branch block Confirmed by Gerlene Fee [phone]) on 12/06/2019 5:21:29 PM   Radiology CT RENAL STONE STUDY  Result Date: 12/06/2019 CLINICAL DATA:  Flank pain. EXAM: CT ABDOMEN AND PELVIS WITHOUT CONTRAST TECHNIQUE: Multidetector CT imaging of the abdomen and pelvis was performed following the standard protocol without IV contrast. COMPARISON:  None. FINDINGS: Lower chest: 5 mm pulmonary nodule within the RIGHT lower lobe (series 2, image 45). Subtle ground-glass opacity at the LEFT lung base (series 2, image 32). Hepatobiliary: No focal liver abnormality is seen. Status post cholecystectomy. No biliary dilatation. Pancreas: Unremarkable. No pancreatic ductal dilatation or surrounding inflammatory changes. Spleen: Normal in size without focal abnormality. Adrenals/Urinary Tract: Adrenal glands appear normal. Kidneys are unremarkable without mass, stone or hydronephrosis. No ureteral or bladder calculi identified. Bladder appears normal, partially decompressed. Stomach/Bowel: No dilated large or small bowel loops. No evidence of bowel wall inflammation. Appendix is normal. Surgical changes of gastric bypass. Vascular/Lymphatic: No significant vascular findings are present. No enlarged abdominal or pelvic lymph nodes. Reproductive: Uterus and bilateral adnexa are unremarkable. Other: No free fluid or abscess collection. No free intraperitoneal air. Musculoskeletal: Mild degenerative spondylosis within the lumbar spine. Associated disc bulge at L4-5 causing central canal stenosis of at least moderate degree with possible associated nerve root impingement. No acute or suspicious osseous finding. IMPRESSION: 1. No acute findings within the abdomen or pelvis. No renal or  ureteral calculi. No bowel obstruction or evidence of bowel wall inflammation. No evidence of acute solid organ abnormality. No free fluid. No evidence of acute osseous abnormality. 2. 5 mm pulmonary nodule within the RIGHT lower lobe. No follow-up needed if patient is low-risk. Non-contrast chest CT can be considered in 12 months if patient is high-risk. This recommendation follows the consensus statement: Guidelines for Management of Incidental Pulmonary Nodules Detected on CT Images: From the Fleischner Society 2017; Radiology 2017; 284:228-243. 3. Prominent disc bulge at L4-5 causing central canal stenosis of at least moderate degree with possible associated nerve root impingement. If symptoms could be radiculopathic in nature, would consider nonemergent lumbar spine MRI for further characterization. Electronically Signed   By: Franki Cabot M.D.   On: 12/06/2019 16:15    Procedures Procedures (including critical care time)  Medications Ordered in ED Medications  sodium chloride 0.9 % bolus 1,000 mL (0 mLs Intravenous Stopped 12/06/19 1425)  morphine 4 MG/ML injection 4 mg (4 mg Intravenous Given 12/06/19 1341)  ondansetron (ZOFRAN) injection 4 mg (4 mg Intravenous Given 12/06/19 1342)  morphine 4 MG/ML injection 4 mg (4 mg Intravenous Given 12/06/19 1521)  ketorolac (TORADOL) 30 MG/ML injection 30 mg (30 mg Intravenous Given 12/06/19 1521)    ED Course  I have reviewed the triage vital signs and the nursing notes.  Pertinent labs & imaging results that were available during my care of the patient were reviewed by me and considered in my medical decision making (see chart for details).    MDM Rules/Calculators/A&P                     Pain is still present, so more meds ordered.  Pt's labs are unremarkable.  CT pending at d/c.  Pt signed out to Dr. Sedonia Small at shift change.  Final Clinical Impression(s) / ED Diagnoses Final diagnoses:  Flank pain    Rx / DC Orders ED Discharge Orders  Ordered    methocarbamol (ROBAXIN) 500 MG tablet  Every 8 hours PRN     12/06/19 1729           Isla Pence, MD 12/08/19 [phone]

## 2019-12-06 NOTE — ED Notes (Signed)
Pt verbalizes understanding of DC instructions. Pt belongings returned and is ambulatory out of ED.  

## 2019-12-06 NOTE — Discharge Instructions (Addendum)
You were evaluated in the Emergency Department and after careful evaluation, we did not find any emergent condition requiring admission or further testing in the hospital.  Your exam/testing today is overall reassuring.  Your CT scan did not show any kidney stones or any other emergencies.  The best explanation for your pain is a muscle strain or spasm.  Please take Tylenol 1000 mg every 4-6 hours and/or ibuprofen 600 mg every 4-6 hours.  For more significant pain you can use the Robaxin muscle relaxer provided.  Please return to the Emergency Department if you experience any worsening of your condition.  We encourage you to follow up with a primary care provider.  Thank you for allowing [place] to be a part of your care.

## 2019-12-06 NOTE — ED Provider Notes (Signed)
  Provider Note MRN:  [id number]  Arrival date & time: 12/06/19    ED Course and Medical Decision Making  Assumed care from Dr. Gilford Raid at shift change.  Suspect kidney stone awaiting CT.  5:30 PM update: CT scan is without acute process, no stone.  Unclear etiology of patient's right flank pain, suspect MSK, is status post cholecystectomy, the pain is not pleuritic, no chest pain or shortness of breath, doubt PE.  Patient is feeling better, pain controlled, appropriate for discharge.  Procedures  Final Clinical Impressions(s) / ED Diagnoses     ICD-10-CM   1. Flank pain  R10.9     ED Discharge Orders         Ordered    methocarbamol (ROBAXIN) 500 MG tablet  Every 8 hours PRN     12/06/19 1729            Discharge Instructions     You were evaluated in the Emergency Department and after careful evaluation, we did not find any emergent condition requiring admission or further testing in the hospital.  Your exam/testing today is overall reassuring.  Your CT scan did not show any kidney stones or any other emergencies.  The best explanation for your pain is a muscle strain or spasm.  Please take Tylenol 1000 mg every 4-6 hours and/or ibuprofen 600 mg every 4-6 hours.  For more significant pain you can use the Robaxin muscle relaxer provided.  Please return to the Emergency Department if you experience any worsening of your condition.  We encourage you to follow up with a primary care provider.  Thank you for allowing [place] to be a part of your care.     Barth Kirks. Sedonia Small, [place] [email or web address]    Maudie Flakes, MD 12/06/19 1730

## 2019-12-06 NOTE — ED Triage Notes (Signed)
Patient c/o right flank pain that radiates to the right abdomen. Pain started yesterday. Patient denies any problems urinating.

## 2019-12-06 NOTE — ED Notes (Signed)
Pt ambulatory to RR independently  

## 2019-12-06 NOTE — ED Notes (Addendum)
Pt transported to CT ?

## 2019-12-11 DIAGNOSIS — R911 Solitary pulmonary nodule: Secondary | ICD-10-CM | POA: Diagnosis not present

## 2019-12-11 DIAGNOSIS — M62838 Other muscle spasm: Secondary | ICD-10-CM | POA: Diagnosis not present

## 2019-12-21 DIAGNOSIS — R509 Fever, unspecified: Secondary | ICD-10-CM | POA: Diagnosis not present

## 2019-12-21 DIAGNOSIS — R05 Cough: Secondary | ICD-10-CM | POA: Diagnosis not present

## 2019-12-21 DIAGNOSIS — R6883 Chills (without fever): Secondary | ICD-10-CM | POA: Diagnosis not present

## 2019-12-21 DIAGNOSIS — Z20828 Contact with and (suspected) exposure to other viral communicable diseases: Secondary | ICD-10-CM | POA: Diagnosis not present

## 2020-01-16 ENCOUNTER — Ambulatory Visit (INDEPENDENT_AMBULATORY_CARE_PROVIDER_SITE_OTHER): Payer: BC Managed Care – PPO | Admitting: Pulmonary Disease

## 2020-01-16 ENCOUNTER — Other Ambulatory Visit: Payer: Self-pay

## 2020-01-16 ENCOUNTER — Encounter: Payer: Self-pay | Admitting: Pulmonary Disease

## 2020-01-16 VITALS — BP 118/80 | HR 72 | Ht 62.5 in | Wt 292.6 lb

## 2020-01-16 DIAGNOSIS — R0683 Snoring: Secondary | ICD-10-CM

## 2020-01-16 DIAGNOSIS — R05 Cough: Secondary | ICD-10-CM

## 2020-01-16 DIAGNOSIS — R911 Solitary pulmonary nodule: Secondary | ICD-10-CM

## 2020-01-16 DIAGNOSIS — R918 Other nonspecific abnormal finding of lung field: Secondary | ICD-10-CM

## 2020-01-16 DIAGNOSIS — R059 Cough, unspecified: Secondary | ICD-10-CM

## 2020-01-16 MED ORDER — PULMICORT FLEXHALER 90 MCG/ACT IN AEPB: 1.0000 | INHALATION_SPRAY | Freq: Two times a day (BID) | RESPIRATORY_TRACT | 0 refills | Status: DC

## 2020-01-16 NOTE — Progress Notes (Signed)
Synopsis: Referred in April 2021 for lung nodule by Enid Skeens., MD  Subjective:   PATIENT ID: Brandy Lewis GENDER: female DOB: 1961-02-20, MRN: [id number]  Chief Complaint  Patient presents with  . Consult    Pt states she was seen in the ED about 1 month ago and had a CT performed. Pt stated that a nodule was seen on the scan. Pt states that she has had problems with allergies x6-8weeks. Pt states she has had a cough and throat clearing that is worse with exertion    This is a 59 year old female past medical history of allergies gallstones mother with a family history of lung cancer.  She is a lifelong never smoker.  Patient referred for evaluation of lung nodule.  Patient had a CT renal stone study completed in the emergency department which revealed a lower chest 5 mm pulmonary nodule as well as subtle groundglass opacity within the left lung base.  Patient was referred to pulmonary for further evaluation.  Patient states that her mother has a history of lung cancer diagnosed early in her 39s.  Unfortunately she had a longstanding history of smoking as well.  However this is a young age for diagnosis of lung cancer.  Of note she does complain of chronic cough.  Been going on for several months.  She is predominantly feels that this is annoying.  It is manageable but she does see a difference related to when her reflux flares.  She also has difficulty sleeping at night with falling asleep as well as snoring.  She has not seen a sleep physician before.   Past Medical History:  Diagnosis Date  . Allergy   . Fatty liver   . Gallstones      Family History  Problem Relation Age of Onset  . Lung cancer Mother   . Diabetes Father   . Heart disease Father      Past Surgical History:  Procedure Laterality Date  . BARIATRIC SURGERY    . BIOPSY  08/11/2018   Procedure: BIOPSY;  Surgeon: Mauri Pole, MD;  Location: [facility];  Service: Endoscopy;;  . CHOLECYSTECTOMY    .  COLONOSCOPY WITH PROPOFOL [name] 08/11/2018   Procedure: COLONOSCOPY WITH PROPOFOL;  Surgeon: Mauri Pole, MD;  Location: [facility];  Service: Endoscopy;  Laterality: [name];  . DILATION AND CURETTAGE OF UTERUS    . ENDOMETRIAL ABLATION    . GALLBLADDER SURGERY    . GASTRIC BYPASS    . POLYPECTOMY  08/11/2018   Procedure: POLYPECTOMY;  Surgeon: Mauri Pole, MD;  Location: [facility];  Service: Endoscopy;;    Social History   Socioeconomic History  . Marital status: Married    Spouse name: Elta Guadeloupe  . Number of children: 1  . Years of education: Not on file  . Highest education level: Not on file  Occupational History  . Not on file  Tobacco Use  . Smoking status: Never Smoker  . Smokeless tobacco: Never Used  Substance and Sexual Activity  . Alcohol use: No  . Drug use: No  . Sexual activity: Yes  Other Topics Concern  . Not on file  Social History Narrative  . Not on file   Social Determinants of Health   Financial Resource Strain:   . Difficulty of Paying Living Expenses:   Food Insecurity:   . Worried About Charity fundraiser in the Last Year:   . [place] in the Last Year:  Transportation Needs:   . Film/video editor (Medical):   Marland Kitchen Lack of Transportation (Non-Medical):   Physical Activity:   . Days of Exercise per Week:   . Minutes of Exercise per Session:   Stress:   . Feeling of Stress :   Social Connections:   . Frequency of Communication with Friends and Family:   . Frequency of Social Gatherings with Friends and Family:   . Attends Religious Services:   . Active Member of Clubs or Organizations:   . Attends Archivist Meetings:   Marland Kitchen Marital Status:   Intimate Partner Violence:   . Fear of Current or Ex-Partner:   . Emotionally Abused:   Marland Kitchen Physically Abused:   . Sexually Abused:      Allergies  Allergen Reactions  . Diflucan [Fluconazole]     Causes yeast infections   . Latex Rash  . Tape Rash      Outpatient Medications Prior to Visit  Medication Sig Dispense Refill  . BIOTIN PO Take 1 tablet by mouth daily.    . Cyanocobalamin (B-12 PO) Take 1 tablet by mouth daily.    . fexofenadine (ALLEGRA ALLERGY) 180 MG tablet Take 180 mg by mouth daily.     Marland Kitchen FOLIC ACID PO Take 1 tablet by mouth daily.    . Multiple Vitamins-Minerals (ADULT GUMMY) CHEW Chew 4 tablets by mouth daily.    Marland Kitchen omeprazole (PRILOSEC OTC) 20 MG tablet Take 20 mg by mouth daily.    Marland Kitchen OVER THE COUNTER MEDICATION Take 1 capsule by mouth daily. diurex otc supplement    . polyvinyl alcohol (LIQUIFILM TEARS) 1.4 % ophthalmic solution Place 1 drop into both eyes as needed for dry eyes.    Marland Kitchen acetaminophen (TYLENOL) 500 MG tablet Take 1,000 mg by mouth daily as needed for moderate pain or headache.    . naproxen sodium (ALEVE) 220 MG tablet Take 220 mg by mouth 2 (two) times daily as needed (pain/headache).    . AMBULATORY NON FORMULARY MEDICATION Medication Name: 0.125% Nitroglycerin ointment  Use Pea sized amount tectally three times a day with the Recticare (Patient not taking: Reported on 12/06/2019) 30 g 1  . methocarbamol (ROBAXIN) 500 MG tablet Take 1 tablet (500 mg total) by mouth every 8 (eight) hours as needed for muscle spasms. 30 tablet 0  . traMADol (ULTRAM) 50 MG tablet Take 1 tablet (50 mg total) by mouth every 6 (six) hours as needed. (Patient not taking: Reported on 12/06/2019) 15 tablet 0   No facility-administered medications prior to visit.    Review of Systems  Constitutional: Negative for chills, fever, malaise/fatigue and weight loss.  HENT: Negative for hearing loss, sore throat and tinnitus.   Eyes: Negative for blurred vision and double vision.  Respiratory: Positive for cough. Negative for hemoptysis, sputum production, shortness of breath, wheezing and stridor.   Cardiovascular: Negative for chest pain, palpitations, orthopnea, leg swelling and PND.  Gastrointestinal: Negative for abdominal pain,  constipation, diarrhea, heartburn, nausea and vomiting.  Genitourinary: Negative for dysuria, hematuria and urgency.  Musculoskeletal: Negative for joint pain and myalgias.  Skin: Negative for itching and rash.  Neurological: Negative for dizziness, tingling, weakness and headaches.  Endo/Heme/Allergies: Negative for environmental allergies. Does not bruise/bleed easily.  Psychiatric/Behavioral: Negative for depression. The patient is not nervous/anxious and does not have insomnia.   All other systems reviewed and are negative.    Objective:  Physical Exam Vitals reviewed.  Constitutional:      General:  She is not in acute distress.    Appearance: She is well-developed. She is obese.  HENT:     Head: Normocephalic and atraumatic.  Eyes:     General: No scleral icterus.    Conjunctiva/sclera: Conjunctivae normal.     Pupils: Pupils are equal, round, and reactive to light.  Neck:     Vascular: No JVD.     Trachea: No tracheal deviation.  Cardiovascular:     Rate and Rhythm: Normal rate and regular rhythm.     Heart sounds: Normal heart sounds. No murmur.  Pulmonary:     Effort: Pulmonary effort is normal. No tachypnea, accessory muscle usage or respiratory distress.     Breath sounds: Normal breath sounds. No stridor. No wheezing, rhonchi or rales.  Abdominal:     General: Bowel sounds are normal. There is no distension.     Palpations: Abdomen is soft.     Tenderness: There is no abdominal tenderness.     Comments: Obese pannus  Musculoskeletal:        General: No tenderness.     Cervical back: Neck supple.     Comments: Lymphedema and obesity of lower extremities  Lymphadenopathy:     Cervical: No cervical adenopathy.  Skin:    General: Skin is warm and dry.     Capillary Refill: Capillary refill takes less than 2 seconds.     Findings: No rash.  Neurological:     Mental Status: She is alert and oriented to person, place, and time.  Psychiatric:        Behavior:  Behavior normal.      Vitals:   01/16/20 1122  BP: 118/80  Pulse: 72  SpO2: 99%  Weight: 292 lb 9.6 oz (132.7 kg)  Height: 5' 2.5" (1.588 m)   99% on RA BMI Readings from Last 3 Encounters:  01/16/20 52.66 kg/m  12/06/19 49.38 kg/m  08/08/19 46.80 kg/m   Wt Readings from Last 3 Encounters:  01/16/20 292 lb 9.6 oz (132.7 kg)  12/06/19 270 lb (122.5 kg)  08/08/19 260 lb (117.9 kg)     CBC    Component Value Date/Time   WBC 7.7 12/06/2019 1343   RBC 5.19 (H) 12/06/2019 1343   HGB 14.1 12/06/2019 1343   HCT 43.8 12/06/2019 1343   PLT 303 12/06/2019 1343   MCV 84.4 12/06/2019 1343   MCH 27.2 12/06/2019 1343   MCHC 32.2 12/06/2019 1343   RDW 12.8 12/06/2019 1343      Chest Imaging: March 2021 CT renal stone study: Lower lobe lung fields examined.  Subcentimeter pulmonary nodules in the right lower lobe groundglass opacity within the left lower lobe.  Pulmonary Functions Testing Results: No flowsheet data found.  FeNO: None   Pathology: None   Echocardiogram: None   Heart Catheterization: None     Assessment & Plan:      ICD-10-CM   1. Nodule of lower lobe of right lung  R91.1 CT Chest Wo Contrast  2. Ground glass opacity present on imaging of lung  R91.8 CT Chest Wo Contrast  3. Snoring  R06.83   4. Cough  R05    Discussion:  Assessment:   New diagnosis 43mm right lower lobe pulmonary nodule, left lower lobe groundglass opacity, undetermined significance appears to be at least low risk for development of malignancy based on location of nodule plus history however not excluded.  She does not have full chest imaging to have a complete picture.  Plan Following  Extensive Data Review & Interpretation:  . I reviewed prior external note(s) from emergency department visit Dr. Sedonia Small 12/06/2019 . I reviewed the result(s) of 12/06/2019 lab work from emergency department visit . I have ordered noncontrasted CT of the chest for full evaluation of bilateral lungs  and reevaluation of lower lobe nodules  Independent interpretation of tests . Review of patient's renal CT imaging lower lobe lung fields 12/06/2019 images revealed lower lobe pulmonary nodule groundglass opacity within the left lower lobe. The patient's images have been independently reviewed by me.    Telephone visit to discuss CT results within the next few weeks after its completed. If the remainder of the chest imaging is stable with subcentimeter nodules we can discuss risk-benefit alternatives of proceeding with subsequent images.  Likely plan for noncontrasted CT of the chest in 6 months if all nodules remain below 1 cm.  Referral to sleep clinic for snoring and possible obstructive sleep apnea. As for the patient's cough we will start Pulmicort.  She has an albuterol inhaler already at home but rarely uses it. She could very well have asthma but she has multifactorial reasons for cough to include reflux, likely undiagnosed OSA.  We will see if the patient's symptoms improve at all with Pulmicort if she does have seasonal allergies and potential for a mixed allergic and obesity related asthma type syndrome.      Current Outpatient Medications:  .  BIOTIN PO, Take 1 tablet by mouth daily., Disp: , Rfl:  .  Cyanocobalamin (B-12 PO), Take 1 tablet by mouth daily., Disp: , Rfl:  .  fexofenadine (ALLEGRA ALLERGY) 180 MG tablet, Take 180 mg by mouth daily. , Disp: , Rfl:  .  FOLIC ACID PO, Take 1 tablet by mouth daily., Disp: , Rfl:  .  Multiple Vitamins-Minerals (ADULT GUMMY) CHEW, Chew 4 tablets by mouth daily., Disp: , Rfl:  .  omeprazole (PRILOSEC OTC) 20 MG tablet, Take 20 mg by mouth daily., Disp: , Rfl:  .  OVER THE COUNTER MEDICATION, Take 1 capsule by mouth daily. diurex otc supplement, Disp: , Rfl:  .  polyvinyl alcohol (LIQUIFILM TEARS) 1.4 % ophthalmic solution, Place 1 drop into both eyes as needed for dry eyes., Disp: , Rfl:  .  acetaminophen (TYLENOL) 500 MG tablet, Take  1,000 mg by mouth daily as needed for moderate pain or headache., Disp: , Rfl:  .  naproxen sodium (ALEVE) 220 MG tablet, Take 220 mg by mouth 2 (two) times daily as needed (pain/headache)., Disp: , Rfl:    Garner Nash, DO [facility] 01/16/2020 11:45 AM

## 2020-01-16 NOTE — Addendum Note (Signed)
Addended by: Lorretta Harp on: 01/16/2020 01:35 PM   Modules accepted: Orders

## 2020-01-16 NOTE — Patient Instructions (Addendum)
Thank you for visiting Dr. Valeta Harms at [facility]. Today we recommend the following:  Orders Placed This Encounter  Procedures  . CT Chest Wo Contrast   Samples of pulmicort today  Continue PPI  Antihistamines are ok to continue as well   Once repeat CT completed we can review via telephone visit with myself or APP.  Return in about 4 weeks (around 02/13/2020) for with APP or Dr. Valeta Harms.   Please set patient up with Dr. Trinidad Curet for next available sleep consultation.     Please do your part to reduce the spread of COVID-19.

## 2020-01-31 ENCOUNTER — Other Ambulatory Visit: Payer: Self-pay

## 2020-01-31 ENCOUNTER — Ambulatory Visit (INDEPENDENT_AMBULATORY_CARE_PROVIDER_SITE_OTHER)
Admission: RE | Admit: 2020-01-31 | Discharge: 2020-01-31 | Disposition: A | Discharge status: Home / Self Care | Payer: BC Managed Care – PPO | Source: Ambulatory Visit | Attending: Pulmonary Disease | Admitting: Pulmonary Disease

## 2020-01-31 DIAGNOSIS — R918 Other nonspecific abnormal finding of lung field: Secondary | ICD-10-CM | POA: Diagnosis not present

## 2020-01-31 DIAGNOSIS — R911 Solitary pulmonary nodule: Secondary | ICD-10-CM | POA: Diagnosis not present

## 2020-02-08 ENCOUNTER — Ambulatory Visit (INDEPENDENT_AMBULATORY_CARE_PROVIDER_SITE_OTHER): Payer: BC Managed Care – PPO | Admitting: Adult Health

## 2020-02-08 ENCOUNTER — Encounter: Payer: Self-pay | Admitting: Adult Health

## 2020-02-08 ENCOUNTER — Telehealth: Payer: Self-pay | Admitting: Adult Health

## 2020-02-08 ENCOUNTER — Other Ambulatory Visit: Payer: Self-pay

## 2020-02-08 DIAGNOSIS — R911 Solitary pulmonary nodule: Secondary | ICD-10-CM

## 2020-02-08 MED ORDER — BENZONATATE 200 MG PO CAPS: 200.0000 mg | ORAL_CAPSULE | Freq: Three times a day (TID) | ORAL | 1 refills | Status: AC | PRN

## 2020-02-08 NOTE — Progress Notes (Signed)
Virtual Visit via Telephone Note  I connected with Brandy Lewis on 02/08/20 at 10:30 AM EDT by telephone and verified that I am speaking with the correct person using two identifiers.  Location: Patient: Home  Provider: Office    I discussed the limitations, risks, security and privacy concerns of performing an evaluation and management service by telephone and the availability of in person appointments. I also discussed with the patient that there may be a patient responsible charge related to this service. The patient expressed understanding and agreed to proceed.   History of Present Illness: 59 year old female never smoker seen for pulmonary consult January 16, 2020 for lung nodule.   Today's televisit is a 3-week follow-up for lung nodule and cough.  Patient was seen last visit for a pulmonary consult.  She had recently had a CT renal study.  This showed a 5 mm right lower and left lower lobe opacity.  Patient was set up for a dedicated CT chest.  This showed a stable 5 mm right lower lobe and left lower lobe nodule.  We discussed her test results.  And need to repeat this in 1 year.  As she is a never smoker. Patient was having ongoing dry hacking cough.  She was recommended to use daily antihistamine.  Added Tessalon as needed for cough.  And was started on Pulmicort inhaler.  Patient says she has had only a slight improvement in cough.  Continues to have a dry hacking cough that is worse when she is outside believes allergies playing a role in this.  She has some postnasal drip.  She had previously taken prednisone in the past but says she cannot tolerate this.  She denies any hemoptysis chest pain orthopnea PND or increased leg swelling.  Patient Active Problem List   Diagnosis Date Noted  . BRBPR (bright red blood per rectum)   . Morbid obesity with body mass index (BMI) greater than or equal to 50 (HCC)   . Rectal pain    Current Outpatient Medications on File Prior to Visit   Medication Sig Dispense Refill  . acetaminophen (TYLENOL) 500 MG tablet Take 1,000 mg by mouth daily as needed for moderate pain or headache.    Marland Kitchen BIOTIN PO Take 1 tablet by mouth daily.    . Budesonide (PULMICORT FLEXHALER) 90 MCG/ACT inhaler Inhale 1 puff into the lungs 2 (two) times daily. 60 each 0  . Cyanocobalamin (B-12 PO) Take 1 tablet by mouth daily.    . fexofenadine (ALLEGRA ALLERGY) 180 MG tablet Take 180 mg by mouth daily.     Marland Kitchen FOLIC ACID PO Take 1 tablet by mouth daily.    . Multiple Vitamins-Minerals (ADULT GUMMY) CHEW Chew 4 tablets by mouth daily.    Marland Kitchen omeprazole (PRILOSEC OTC) 20 MG tablet Take 20 mg by mouth daily.    Marland Kitchen OVER THE COUNTER MEDICATION Take 1 capsule by mouth daily. diurex otc supplement    . polyvinyl alcohol (LIQUIFILM TEARS) 1.4 % ophthalmic solution Place 1 drop into both eyes as needed for dry eyes.    . naproxen sodium (ALEVE) 220 MG tablet Take 220 mg by mouth 2 (two) times daily as needed (pain/headache).     No current facility-administered medications on file prior to visit.    Observations/Objective: Speaks in full sentences with no audible distress or wheezing  Assessment and Plan: Stable 5 mm right lower lobe and left lower lobe nodule in a never smoker.  We will follow-up CT chest  in 1 year without contrast.  Chronic cough.  Questionable etiology may be secondary to triggers of postnasal drainage and possible GERD.  We will increase cough control and trigger prevention.  Will check PFTs on return  Plan  Patient Instructions  Begin Delsym 2 tsp Twice daily  For cough As needed   Continue on Tessalon Three times a day  For cough As needed   Begin Chlorpheniramine 4mg  At bedtime  .  Continue on Allegra 180mg  daily  Begin Flonase 2 puffs daily  Sips of water to soothe throat .  NO MINTS .  Continue on Prilosec daily.  GERD diet.  Set up CT Chest in 1 year to follow lung nodule .  Follow up with Dr. Valeta Harms in 6 weeks with PFT  Please  contact office for sooner follow up if symptoms do not improve or worsen or seek emergency care       Follow Up Instructions: Follow-up in 6 weeks and as needed   I discussed the assessment and treatment plan with the patient. The patient was provided an opportunity to ask questions and all were answered. The patient agreed with the plan and demonstrated an understanding of the instructions.   The patient was advised to call back or seek an in-person evaluation if the symptoms worsen or if the condition fails to improve as anticipated.  I provided 22 minutes of non-face-to-face time during this encounter.   Rexene Edison, NP

## 2020-02-08 NOTE — Patient Instructions (Signed)
Begin Delsym 2 tsp Twice daily  For cough As needed   Continue on Tessalon Three times a day  For cough As needed   Begin Chlorpheniramine 4mg  At bedtime  .  Continue on Allegra 180mg  daily  Begin Flonase 2 puffs daily  Sips of water to soothe throat .  NO MINTS .  Continue on Prilosec daily.  GERD diet.  Set up CT Chest in 1 year to follow lung nodule .  Follow up with Dr. Valeta Harms in 6 weeks with PFT  Please contact office for sooner follow up if symptoms do not improve or worsen or seek emergency care

## 2020-02-08 NOTE — Telephone Encounter (Signed)
error 

## 2020-02-09 NOTE — Progress Notes (Signed)
PCCM: thanks for seeing her Garner Nash, DO [facility] 02/09/2020 2:59 PM

## 2020-02-11 ENCOUNTER — Other Ambulatory Visit: Payer: Self-pay

## 2020-02-11 ENCOUNTER — Ambulatory Visit (INDEPENDENT_AMBULATORY_CARE_PROVIDER_SITE_OTHER): Payer: BC Managed Care – PPO | Admitting: Acute Care

## 2020-02-11 ENCOUNTER — Telehealth: Payer: Self-pay | Admitting: Adult Health

## 2020-02-11 ENCOUNTER — Encounter: Payer: Self-pay | Admitting: Acute Care

## 2020-02-11 DIAGNOSIS — J209 Acute bronchitis, unspecified: Secondary | ICD-10-CM

## 2020-02-11 DIAGNOSIS — J329 Chronic sinusitis, unspecified: Secondary | ICD-10-CM

## 2020-02-11 DIAGNOSIS — J440 Chronic obstructive pulmonary disease with acute lower respiratory infection: Secondary | ICD-10-CM

## 2020-02-11 DIAGNOSIS — R911 Solitary pulmonary nodule: Secondary | ICD-10-CM

## 2020-02-11 MED ORDER — AMOXICILLIN-POT CLAVULANATE 875-125 MG PO TABS: 1.0000 | ORAL_TABLET | Freq: Two times a day (BID) | ORAL | 0 refills | Status: AC

## 2020-02-11 NOTE — Telephone Encounter (Signed)
error 

## 2020-02-11 NOTE — Patient Instructions (Addendum)
It was good to talk with you today, We will send in a prescription for Augmentin 875 mg twice daily x 7 days. Take until gone.  Please take probiotic while on antibiotic.  Continue  Delsym 2 tsp Twice daily  For cough As needed   Continue on Tessalon Three times a day  For cough As needed   Continue  Chlorpheniramine 4mg  At bedtime  .  Continue on Allegra 180mg  daily  Continue  Flonase 2 puffs daily  Sips of water to soothe throat .  NO MINTS .  Continue on Prilosec daily.  Follow a GERD diet.  CT Chest in 1 year to follow lung nodule .  PFT's as scheduled 04/04/2020  Follow up with Dr. Valeta Harms after PFT's Follow up 02/18/2020 at 11:30 am to ensure you are improving. ( Bronchitis) Call the office sooner if you are not improving, or if you are worsening. Please wear a mask outside while the pollen counts are so high.  Please contact office for sooner follow up if symptoms do not improve or worsen or seek emergency care

## 2020-02-11 NOTE — Progress Notes (Signed)
Virtual Visit via Telephone Note  I connected with Brandy Lewis on 02/11/20 at  4:00 PM EDT by telephone and verified that I am speaking with the correct person using two identifiers.  Location: Patient: At home Provider: Working remotely from home   I discussed the limitations, risks, security and privacy concerns of performing an evaluation and management service by telephone and the availability of in person appointments. I also discussed with the patient that there may be a patient responsible charge related to this service. The patient expressed understanding and agreed to proceed.  Synopsis 59 year old never smoker with incidental finding of pulmonary nodule on a renal CT done in the ED 12/06/2019. This   revealed a lower chest 5 mm pulmonary nodule as well as subtle groundglass opacity within the left lung base.  Patient was referred to pulmonary for further evaluation.   After consult with Dr. Valeta Harms a Dedicated CT Chest done 01/31/2020 showed a Stable 5 mm right lower lobe and 5 mm left lower lobe nodules.with recommendation of 12 month follow up to ensure stability.    History of Present Illness: Pt. Returns for acute tele visit for " Not feeling well". She was last seen by Dr. Valeta Harms 01/16/2020 and started on Pulmicort at that time. She was also seen Friday 02/08/2020 by Rexene Edison, NP for worsening allergies and cough and was started on Chlor Tabs at bedtime and she started on Delsym in addition to the Tessalon she was  Taking for cough. She is compliant with daily antihistamine and  her Pulmicort inhaler, and her PPI. She states she followed Tammy's instructions over the weekend.    She states she went outside this weekend and that she has paid for doing so. She has had worsening of her cough and her secretions have turned green, and blood tinged when she blows her nose. She feels like she is having a flare of bronchitis, which she has had multiple times in the past. CT Chest done 5/6 was  clear. She denies any fever. She denies any wheezing . She states she does not want a prednisone taper, as oral prednisone makes her feel wired. I have asked her to call the office if she has no improvement so we can get her in for a Depo Medrol injection.    Observations/Objective: Significant cough, speaks in full sentences, no audible wheezing by phone. CT Chest 01/31/2020 Stable 5 mm right lower lobe and 5 mm left lower lobe nodules. Non-contrast chest CT recommended in 12 months  Renal CT 12/06/2019 5 mm pulmonary nodule within the RIGHT lower lobe. No follow-up needed if patient is low-risk. Non-contrast chest CT can be considered in 12 months if patient is high-risk  Assessment and Plan: Acute Bronchitis Plan  We will send in a prescription for Augmentin 875 mg twice daily x 7 days. Take until gone.  Please take probiotic while on antibiotic.  Continue  Delsym 2 tsp Twice daily  For cough As needed   Continue on Tessalon Three times a day  For cough As needed   Continue  Chlorpheniramine 4mg  At bedtime  .  Continue on Allegra 180mg  daily  Continue  Flonase 2 puffs daily  Sips of water to soothe throat .  NO MINTS .  Continue on Prilosec daily.  Follow a GERD diet. PFT's as scheduled 04/04/2020  Follow up 02/18/2020 at 11:30 am to ensure you are improving.  Call the office sooner if you are not improving, or if you  are worsening. Please wear a mask outside while the pollen counts are so high.  Please contact office for sooner follow up if symptoms do not improve or worsen or seek emergency care    Pulmonary Nodule in never smoker Second hand smoke exposure as a child Family history of Lung Cancer ( Mother) Plan Follow up CT Chest 01/2021.   Follow Up Instructions: Follow up 02/18/2020 at 11:30 am to ensure you are improving. Covid testing prior.  Follow up with Dr. Valeta Harms after PFT's in July   I discussed the assessment and treatment plan with the patient. The patient was  provided an opportunity to ask questions and all were answered. The patient agreed with the plan and demonstrated an understanding of the instructions.   The patient was advised to call back or seek an in-person evaluation if the symptoms worsen or if the condition fails to improve as anticipated.  I provided 33 minutes of non-face-to-face time during this encounter.   Magdalen Spatz, NP 02/11/2020

## 2020-02-13 NOTE — Progress Notes (Signed)
PCCM: thanks for seeing her Garner Nash, DO [facility] 02/13/2020 6:17 PM

## 2020-02-14 ENCOUNTER — Ambulatory Visit: Payer: BC Managed Care – PPO | Admitting: Adult Health

## 2020-02-18 ENCOUNTER — Ambulatory Visit (INDEPENDENT_AMBULATORY_CARE_PROVIDER_SITE_OTHER): Payer: BC Managed Care – PPO | Admitting: Acute Care

## 2020-02-18 ENCOUNTER — Ambulatory Visit (INDEPENDENT_AMBULATORY_CARE_PROVIDER_SITE_OTHER): Payer: BC Managed Care – PPO

## 2020-02-18 ENCOUNTER — Encounter: Payer: Self-pay | Admitting: Acute Care

## 2020-02-18 ENCOUNTER — Other Ambulatory Visit: Payer: Self-pay

## 2020-02-18 DIAGNOSIS — J329 Chronic sinusitis, unspecified: Secondary | ICD-10-CM

## 2020-02-18 DIAGNOSIS — J440 Chronic obstructive pulmonary disease with acute lower respiratory infection: Secondary | ICD-10-CM

## 2020-02-18 DIAGNOSIS — R911 Solitary pulmonary nodule: Secondary | ICD-10-CM

## 2020-02-18 DIAGNOSIS — Z1231 Encounter for screening mammogram for malignant neoplasm of breast: Secondary | ICD-10-CM | POA: Diagnosis not present

## 2020-02-18 DIAGNOSIS — J209 Acute bronchitis, unspecified: Secondary | ICD-10-CM

## 2020-02-18 MED ORDER — METHYLPREDNISOLONE ACETATE 80 MG/ML IJ SUSP
80.0000 mg | Freq: Once | INTRAMUSCULAR | Status: AC
  Administered 2020-02-18: 80 mg via INTRAMUSCULAR

## 2020-02-18 NOTE — Progress Notes (Signed)
Virtual Visit via Telephone Note  I connected with Brandy Lewis on 02/18/20 at 11:30 AM EDT by telephone and verified that I am speaking with the correct person using two identifiers.  Location: Patient: Standing outside our office Provider: Working remotely from home   I discussed the limitations, risks, security and privacy concerns of performing an evaluation and management service by telephone and the availability of in person appointments. I also discussed with the patient that there may be a patient responsible charge related to this service. The patient expressed understanding and agreed to proceed.  Synopsis 59 year old never smoker with incidental finding of pulmonary nodule on a renal CT done in the ED 12/06/2019. This   revealed a lower chest 5 mm pulmonary nodule as well as subtle groundglass opacity within the left lung base. Patient was referred to pulmonary for further evaluation.  After consult with Dr. Valeta Harms a Dedicated CT Chest done 01/31/2020 showed a Stable 5 mm right lower lobe and 5 mm left lower lobe nodules.with recommendation of 12 month follow up to ensure stability.    History of Present Illness: Pt. Presents for follow up of sinusitis and  bronchitis with PND and allergies and suspected cause. She was started  on  Augmentin 5/17 with symptomatic treatment with Delsym, Tessalon Perles, Chlorpheniramine 4mg  At bedtime , Allegra , Flonase, GERD diet, and asked to continue her Prilosec, She refused a prednisone taper at the time..    She presents for follow up today.She states her secretions are clear. She finished Augmentin  5/24/am., but she is still coughing and wheezing and her voice is hoarse.Marland Kitchen She would like a Depo Medrol injection as she does not do well with oral prednisone.She does not feel like the inhaler has helped her at all. She states she has been compliant with the symptom management. .  Last steroid shot with PCP 3 months ago    Observations/Objective: Significant cough, speaks in full sentences, no audible wheezing by phone. CT Chest 01/31/2020 Stable 5 mm right lower lobe and 5 mm left lower lobe nodules. Non-contrast chest CT recommended in 12 months  Renal CT 12/06/2019 5 mm pulmonary nodule within the RIGHT lower lobe. No follow-up needed if patient is low-risk. Non-contrast chest CT can be considered in 12 months if patient is high-risk   Assessment and Plan: Slow to resolve acute bronchitis/ sinusitis Completed Augmentin x 7 days today Plan Depo Medrol 80 mg IM now Follow up 6/2/20201 Tele Visit with Judson Roch NP Continue symptomatic relied neasures Continue GERD regimen Continue Seasonal allergy regiman Sips of water instead of throat clearing Sugar Free [employer] or [employer] originals for throat soothing. Delsym Cough syrup 5 cc's every 12 hours Non-sedating antihistamine of your choice daily ( Zyrtec, Allegra, Xyzol, Claritin ( Generic ok)  Pulmonary Nodule Needs follow up LDCT 01/2021  Follow Up Instructions: Follow up tele visit 02/27/2020 with Judson Roch NP   I discussed the assessment and treatment plan with the patient. The patient was provided an opportunity to ask questions and all were answered. The patient agreed with the plan and demonstrated an understanding of the instructions.   The patient was advised to call back or seek an in-person evaluation if the symptoms worsen or if the condition fails to improve as anticipated.  I provided 25 minutes of non-face-to-face time during this encounter.   Magdalen Spatz, NP 02/18/2020

## 2020-02-18 NOTE — Patient Instructions (Addendum)
It was good to talk with you today Depo Medrol 80 mg IM now Follow up 6/2/20201 Tele Visit with Judson Roch NP Continue symptomatic relied neasures Continue GERD regimen Continue Seasonal allergy regiman Sips of water instead of throat clearing Sugar Free [employer] or [employer] originals for throat soothing. Delsym Cough syrup 5 cc's every 12 hours Non-sedating antihistamine of your choice daily ( Zyrtec, Allegra, Xyzol, Claritin ( Generic ok) Follow up CT Chest 01/2021 to evaluate pulmonary nodule Follow Up Instructions: Follow up tele visit 02/27/2020 with Judson Roch NP

## 2020-02-27 ENCOUNTER — Other Ambulatory Visit: Payer: Self-pay

## 2020-02-27 ENCOUNTER — Ambulatory Visit (INDEPENDENT_AMBULATORY_CARE_PROVIDER_SITE_OTHER): Payer: BC Managed Care – PPO | Admitting: Acute Care

## 2020-02-27 ENCOUNTER — Encounter: Payer: Self-pay | Admitting: Acute Care

## 2020-02-27 DIAGNOSIS — R062 Wheezing: Secondary | ICD-10-CM | POA: Diagnosis not present

## 2020-02-27 MED ORDER — ALBUTEROL SULFATE HFA 108 (90 BASE) MCG/ACT IN AERS: 2.0000 | INHALATION_SPRAY | Freq: Four times a day (QID) | RESPIRATORY_TRACT | 2 refills | Status: DC | PRN

## 2020-02-27 NOTE — Progress Notes (Signed)
Virtual Visit via Telephone Note  I connected with Brandy Lewis on 02/27/20 at  2:30 PM EDT by telephone and verified that I am speaking with the correct person using two identifiers.  Location: Patient: At Home Provider: [place], [place], [place], [address]   I discussed the limitations, risks, security and privacy concerns of performing an evaluation and management service by telephone and the availability of in person appointments. I also discussed with the patient that there may be a patient responsible charge related to this service. The patient expressed understanding and agreed to proceed.  Synopsis 59 year old never smoker with incidental finding of pulmonary nodule on a renal CT done in the ED 12/06/2019. This   revealed a lower chest 5 mm pulmonary nodule as well as subtle groundglass opacity within the left lung base. Patient was referred to pulmonary for further evaluation.  After consult with Brandy Lewis a Dedicated CT Chest done 01/31/2020 showed a Stable 5 mm right lower lobe and 5 mm left lower lobe nodules.with recommendation of 12 month follow up to ensure stability.    History of Present Illness: Pt presents for follow up of acute bronchitis and sinusitis. She was initially seen 5/17 and treated with Augmentin. She deferred a prednisone taper at the time. At follow up 5/24 she stated she had completed her Augmentin and her sinusitis was better, but she was still coughing and had episodes of wheezing. She does not like oral prednisone, as it " makes her crazy", but she does well with Depo Medrol injections. She was asking for a Depo Medrol injection. She was treated with Depo Medrol 80 mg. She presents today for follow up.  Pt. States she has been better. She no longer needs her cough medication. She states she does have occasional wheezing with exertion. She does not have a rescue inhaler.She has not been using her Pulmicort inhaler. She has been compliant with her  allergy regimen.She is scheduled for PFT's 04/04/2020.    Observations/Objective: No cough or wheezing noted per phone Speaking in full sentences.   CT Chest 01/31/2020 Stable 5 mm right lower lobe and 5 mm left lower lobe nodules. Non-contrast chest CT recommended in 12 months  Renal CT 12/06/2019 5 mm pulmonary nodule within the RIGHT lower lobe. No follow-up needed if patient is low-risk. Non-contrast chest CT can be considered in 12 months if patient is high-risk  Assessment and Plan: Resolved Bronchitis/ Sinusitis Treated with Augmentin and Depo Medrol 80 Plan Please reconsider using your Pulmicort inhaler one puff twice daily. This may help with your wheezing and shortness of breath.  We will send in a prescription for a rescue inhaler. Use 1-2 puffs as needed for shortness of breath or wheezing up to three times daily max. Call to be seen if you need more often. Continue  Chlorpheniramine 4mg  At bedtime .  Continue on Allegra 180mg  daily  Continue  Flonase 2 puffs daily  Sips of water to soothe throat .  NO MINTS .  Continue on Prilosec daily.  Follow a GERD diet. PFT's as scheduled 04/04/2020  Follow up after PFT's with Brandy Lewis, Brandy Lewis, or Brandy Lewis. Please schedule for Week of 7/ 08/2020. Please contact office for sooner follow up if symptoms do not improve or worsen or seek emergency care   Follow Up Instructions: Follow up week of 04/07/2020 after PFT's    I discussed the assessment and treatment plan with the patient. The patient was provided  an opportunity to ask questions and all were answered. The patient agreed with the plan and demonstrated an understanding of the instructions.   The patient was advised to call back or seek an in-person evaluation if the symptoms worsen or if the condition fails to improve as anticipated.  I provided 30  minutes of non-face-to-face time during this encounter.   Magdalen Spatz, Lewis 02/27/2020 3:12 PM

## 2020-02-27 NOTE — Patient Instructions (Addendum)
It was good to talk with you today. Please reconsider using your Pulmicort inhaler one puff twice daily. This may help with your wheezing and shortness of breath.  We will send in a prescription for a rescue inhaler. Use 1-2 puffs as needed for shortness of breath or wheezing up to three times daily max. Call to be seen if you need more often. Continue  Chlorpheniramine 4mg  At bedtime .  Continue on Allegra 180mg  daily  Continue  Flonase 2 puffs daily  Sips of water to soothe throat .  NO MINTS .  Continue on Prilosec daily.  Follow a GERD diet. PFT's as scheduled 04/04/2020  Follow up after PFT's with Judson Roch NP, Tammy NP, or Dr. Valeta Harms. Please schedule for Week of 7/ 08/2020. Please contact office for sooner follow up if symptoms do not improve or worsen or seek emergency care

## 2020-03-02 DIAGNOSIS — Z20822 Contact with and (suspected) exposure to covid-19: Secondary | ICD-10-CM | POA: Diagnosis not present

## 2020-03-04 NOTE — Progress Notes (Signed)
Thanks for seeing her Garner Nash, DO [facility] 03/04/2020 5:03 PM

## 2020-03-10 NOTE — Progress Notes (Signed)
PCCM: Thanks for seeing her [facility] 03/10/2020 5:47 PM

## 2020-04-01 ENCOUNTER — Other Ambulatory Visit (HOSPITAL_COMMUNITY)
Admission: RE | Admit: 2020-04-01 | Discharge: 2020-04-01 | Disposition: A | Discharge status: Home / Self Care | Payer: BC Managed Care – PPO | Source: Ambulatory Visit | Attending: Adult Health | Admitting: Adult Health

## 2020-04-01 DIAGNOSIS — Z20822 Contact with and (suspected) exposure to covid-19: Secondary | ICD-10-CM | POA: Diagnosis not present

## 2020-04-01 DIAGNOSIS — Z01812 Encounter for preprocedural laboratory examination: Secondary | ICD-10-CM | POA: Diagnosis not present

## 2020-04-01 LAB — SARS CORONAVIRUS 2 (TAT 6-24 HRS): SARS Coronavirus 2: NEGATIVE

## 2020-04-03 ENCOUNTER — Other Ambulatory Visit: Payer: Self-pay | Admitting: *Deleted

## 2020-04-03 DIAGNOSIS — R062 Wheezing: Secondary | ICD-10-CM

## 2020-04-04 ENCOUNTER — Other Ambulatory Visit: Payer: Self-pay

## 2020-04-04 ENCOUNTER — Ambulatory Visit (INDEPENDENT_AMBULATORY_CARE_PROVIDER_SITE_OTHER): Payer: BC Managed Care – PPO | Admitting: Pulmonary Disease

## 2020-04-04 DIAGNOSIS — R062 Wheezing: Secondary | ICD-10-CM

## 2020-04-04 LAB — PULMONARY FUNCTION TEST
DL/VA % pred: 138 %
DL/VA: 5.99 ml/min/mmHg/L
DLCO cor % pred: 145 %
DLCO cor: 27.83 ml/min/mmHg
DLCO unc % pred: 145 %
DLCO unc: 27.83 ml/min/mmHg
FEF 25-75 Post: 2.73 L/sec
FEF 25-75 Pre: 2.62 L/sec
FEF2575-%Change-Post: 4 %
FEF2575-%Pred-Post: 113 %
FEF2575-%Pred-Pre: 108 %
FEV1-%Change-Post: 2 %
FEV1-%Pred-Post: 105 %
FEV1-%Pred-Pre: 103 %
FEV1-Post: 2.61 L
FEV1-Pre: 2.55 L
FEV1FVC-%Change-Post: 4 %
FEV1FVC-%Pred-Pre: 102 %
FEV6-%Change-Post: -1 %
FEV6-%Pred-Post: 100 %
FEV6-%Pred-Pre: 102 %
FEV6-Post: 3.09 L
FEV6-Pre: 3.15 L
FEV6FVC-%Pred-Post: 103 %
FEV6FVC-%Pred-Pre: 103 %
FVC-%Change-Post: -1 %
FVC-%Pred-Post: 97 %
FVC-%Pred-Pre: 99 %
FVC-Post: 3.09 L
FVC-Pre: 3.15 L
Post FEV1/FVC ratio: 84 %
Post FEV6/FVC ratio: 100 %
Pre FEV1/FVC ratio: 81 %
Pre FEV6/FVC Ratio: 100 %
RV % pred: 92 %
RV: 1.67 L
TLC % pred: 102 %
TLC: 4.89 L

## 2020-04-04 NOTE — Progress Notes (Signed)
PFT done today. 

## 2020-04-09 ENCOUNTER — Other Ambulatory Visit: Payer: Self-pay

## 2020-04-09 ENCOUNTER — Ambulatory Visit (INDEPENDENT_AMBULATORY_CARE_PROVIDER_SITE_OTHER): Payer: BC Managed Care – PPO | Admitting: Acute Care

## 2020-04-09 ENCOUNTER — Encounter: Payer: Self-pay | Admitting: Acute Care

## 2020-04-09 VITALS — BP 120/80 | HR 82 | Temp 97.5°F | Ht 62.0 in | Wt 299.6 lb

## 2020-04-09 DIAGNOSIS — J410 Simple chronic bronchitis: Secondary | ICD-10-CM

## 2020-04-09 DIAGNOSIS — R911 Solitary pulmonary nodule: Secondary | ICD-10-CM

## 2020-04-09 NOTE — Progress Notes (Addendum)
History of Present Illness Brandy Lewis is a 59 y.o. female  never smoker with incidental finding of pulmonary nodule on a renal CT done in the ED 12/06/2019. Thisrevealed a lower chest 5 mm pulmonary nodule as well as subtle groundglass opacity within the left lung base. Patient was referred to pulmonary for further evaluation. After consult with Dr. Valeta Harms a Dedicated CT Chest done 01/31/2020 showed aStable 5 mm right lower lobe and 5 mm left lower lobe nodules.with recommendation of 12 month follow up to ensure stability.She has had further wok up for bronchitis.    04/09/2020 Follow up PFT's Pt. Presents for follow up after PFT's. She has recovered from a slow to resolve bronchitis. She has been doing well. No issues at present. She states she has not been using her Pulmicort, as she felt it did not help with her dyspnea. . She has been using a rescue inhaler as needed, however. She states the rescue inhaler works better for her. She has not had to use it lately. She continues to use her Allegra in the morning, and the Chlorpheniramine at bedtime. This has helped significantly. She is compliant with her Prilosec. She is deferring on home sleep study to evaluate for OSA.  PFT's done 04/04/2020 : FVC 3.15/3.17/99% FEV1 2.55/ 2.47/103% F/F Ratio 81/81/102% TLC 4.89/4.75/102% DLCO 27.83/19.18/145%  I explained that these are normal  results . Her obesity and deconditioned status may be contributing to dyspnea She verbalized understanding, and has agreed to calling for early symptom management of her bronchitis when she flares. She agrees to use inhalers when she is flaring. Test Results: 03/2020        CBC Latest Ref Rng & Units 12/06/2019 10/23/2008  WBC 4.0 - 10.5 K/uL 7.7 7.2  Hemoglobin 12.0 - 15.0 g/dL 14.1 14.6  Hematocrit 36 - 46 % 43.8 43.0  Platelets 150 - 400 K/uL 303 247    BMP Latest Ref Rng & Units 12/06/2019 10/23/2008  Glucose 70 - 99 mg/dL 114(H) 91  BUN 6 - 20 mg/dL  21(H) 9  Creatinine 0.44 - 1.00 mg/dL 0.87 0.69  Sodium 135 - 145 mmol/L 141 134(L)  Potassium 3.5 - 5.1 mmol/L 4.2 3.3(L)  Chloride 98 - 111 mmol/L 110 101  CO2 22 - 32 mmol/L 22 27  Calcium 8.9 - 10.3 mg/dL 9.1 8.8    BNP No results found for: BNP  ProBNP No results found for: PROBNP  PFT    Component Value Date/Time   FEV1PRE 2.55 04/04/2020 1301   FEV1POST 2.61 04/04/2020 1301   FVCPRE 3.15 04/04/2020 1301   FVCPOST 3.09 04/04/2020 1301   TLC 4.89 04/04/2020 1301   DLCOUNC 27.83 04/04/2020 1301   PREFEV1FVCRT 81 04/04/2020 1301   PSTFEV1FVCRT 84 04/04/2020 1301    No results found.   Past medical hx Past Medical History:  Diagnosis Date   Allergy    Fatty liver    Gallstones      Social History   Tobacco Use   Smoking status: Passive Smoke Exposure - Never Smoker   Smokeless tobacco: Never Used   Tobacco comment: Significant second hand smoke exposure as a child  Vaping Use   Vaping Use: Never used  Substance Use Topics   Alcohol use: No   Drug use: No    Brandy.Lewis reports that she is a non-smoker but has been exposed to tobacco smoke. She has been exposed to tobacco smoke for the past 25.00 years. She has never used smokeless tobacco.  She reports that she does not drink alcohol and does not use drugs.  Tobacco Cessation: Never smoker with significant second hand exposure as a child. Past surgical hx, Family hx, Social hx all reviewed.  Current Outpatient Medications on File Prior to Visit  Medication Sig   acetaminophen (TYLENOL) 500 MG tablet Take 1,000 mg by mouth daily as needed for moderate pain or headache.   albuterol (VENTOLIN HFA) 108 (90 Base) MCG/ACT inhaler Inhale 2 puffs into the lungs every 6 (six) hours as needed for wheezing or shortness of breath.   benzonatate (TESSALON) 200 MG capsule Take 1 capsule (200 mg total) by mouth 3 (three) times daily as needed for cough.   BIOTIN PO Take 1 tablet by mouth daily.    Cyanocobalamin (B-12 PO) Take 1 tablet by mouth daily.   fexofenadine (ALLEGRA ALLERGY) 180 MG tablet Take 180 mg by mouth daily.    FOLIC ACID PO Take 1 tablet by mouth daily.   Multiple Vitamins-Minerals (ADULT GUMMY) CHEW Chew 4 tablets by mouth daily.   naproxen sodium (ALEVE) 220 MG tablet Take 220 mg by mouth 2 (two) times daily as needed (pain/headache).   omeprazole (PRILOSEC OTC) 20 MG tablet Take 20 mg by mouth daily.   OVER THE COUNTER MEDICATION Take 1 capsule by mouth daily. diurex otc supplement   polyvinyl alcohol (LIQUIFILM TEARS) 1.4 % ophthalmic solution Place 1 drop into both eyes as needed for dry eyes.   Budesonide (PULMICORT FLEXHALER) 90 MCG/ACT inhaler Inhale 1 puff into the lungs 2 (two) times daily. (Patient not taking: Reported on 04/09/2020)   No current facility-administered medications on file prior to visit.     Allergies  Allergen Reactions   Diflucan [Fluconazole]     Causes yeast infections    Latex Rash   Tape Rash    Review Of Systems:  Constitutional:   No  weight loss, night sweats,  Fevers, chills, fatigue, or  lassitude.  HEENT:   No headaches,  Difficulty swallowing,  Tooth/dental problems, or  Sore throat,                No sneezing, itching, ear ache, nasal congestion, post nasal drip,   CV:  No chest pain,  Orthopnea, PND, swelling in lower extremities, anasarca, dizziness, palpitations, syncope.   GI  No heartburn, indigestion, abdominal pain, nausea, vomiting, diarrhea, change in bowel habits, loss of appetite, bloody stools.   Resp: No shortness of breath with exertion or at rest.  No excess mucus, no productive cough,  No non-productive cough,  No coughing up of blood.  No change in color of mucus.  No wheezing.  No chest wall deformity  Skin: no rash or lesions.  GU: no dysuria, change in color of urine, no urgency or frequency.  No flank pain, no hematuria   Brandy:  No joint pain or swelling.  No decreased range of motion.   No back pain.  Psych:  No change in mood or affect. No depression or anxiety.  No memory loss.   Vital Signs BP 120/80 (BP Location: Left Arm, Cuff Size: Large)    Pulse 82    Temp (!) 97.5 F (36.4 C) (Oral)    Ht 5\' 2"  (1.575 m)    Wt 299 lb 9.6 oz (135.9 kg)    SpO2 97%    BMI 54.80 kg/m    Physical Exam:  General- No distress,  A&Ox3, pleasant ENT: No sinus tenderness, TM clear, pale nasal mucosa, no  oral exudate,no post nasal drip, no LAN Cardiac: S1, S2, regular rate and rhythm, no murmur Chest: No wheeze/ rales/ dullness; no accessory muscle use, no nasal flaring, no sternal retractions Abd.: Soft Non-tender, ND, BS + Ext: No clubbing cyanosis, edema Neuro:  normal strength, MAE x 4, A&O x 3 Skin: No rashes, No lesions, warm and dry Psych: normal mood and behavior   Assessment/Plan Slow To Resolve Bronchitis  flare PFT's are normal No BD response Plan Continue using your rescue inhaler as needed for shortness of breath of wheezing. When you are having a bronchitis  flare, please add your Pulmicort Flex inhaler Call to be seen at the first sign of bronchitis flare, so we can treat early and shorten length of time of flare. Continue using your Allegra and Chlorpheniramine as you have been doing. Follow up CT Chest 01/2021, follow up pulmonary nodules Call as needed for flare or any pulmonary issues. Please contact office for sooner follow up if symptoms do not improve or worsen or seek emergency care   This appointment was 30 min long with over 50% of the time in direct face-to-face patient care, assessment, plan of care, and follow-up.  Pulmonary Nodules Plan CT chest 01/2021   Magdalen Spatz, NP 04/09/2020  2:17 PM

## 2020-04-09 NOTE — Patient Instructions (Addendum)
It was good to see you today. Continue using your rescue inhaler as needed for shortness of breath of wheezing. When you are flaring, please add your Pulmicort Flex inhaler Call to be seen at the first sign of bronchitis flare, so we can treat early and shorten length of time of flare. Continue using your Allegra and Chlorpheniramine as you have been doing. Follow up CT Chest 01/2021, follow up pulmonary nodules Call as needed for flare or any pulmonary issues. Please contact office for sooner follow up if symptoms do not improve or worsen or seek emergency care

## 2020-04-10 NOTE — Progress Notes (Signed)
Thanks for seeing her Garner Nash, DO [facility] 04/10/2020 11:00 AM

## 2020-04-30 DIAGNOSIS — M9905 Segmental and somatic dysfunction of pelvic region: Secondary | ICD-10-CM | POA: Diagnosis not present

## 2020-04-30 DIAGNOSIS — M25552 Pain in left hip: Secondary | ICD-10-CM | POA: Diagnosis not present

## 2020-04-30 DIAGNOSIS — M5384 Other specified dorsopathies, thoracic region: Secondary | ICD-10-CM | POA: Diagnosis not present

## 2020-04-30 DIAGNOSIS — M9902 Segmental and somatic dysfunction of thoracic region: Secondary | ICD-10-CM | POA: Diagnosis not present

## 2020-05-03 ENCOUNTER — Ambulatory Visit
Admission: EM | Admit: 2020-05-03 | Discharge: 2020-05-03 | Disposition: A | Discharge status: Home / Self Care | Payer: BC Managed Care – PPO | Attending: Physician Assistant | Admitting: Physician Assistant

## 2020-05-03 ENCOUNTER — Other Ambulatory Visit: Payer: Self-pay

## 2020-05-03 ENCOUNTER — Encounter: Payer: Self-pay | Admitting: Emergency Medicine

## 2020-05-03 DIAGNOSIS — B029 Zoster without complications: Secondary | ICD-10-CM | POA: Diagnosis not present

## 2020-05-03 MED ORDER — VALACYCLOVIR HCL 1 G PO TABS: 1000.0000 mg | ORAL_TABLET | Freq: Three times a day (TID) | ORAL | 0 refills | Status: AC

## 2020-05-03 NOTE — Discharge Instructions (Signed)
Start valtrex as directed. Follow up with PCP for recheck in 7 days.

## 2020-05-03 NOTE — ED Provider Notes (Signed)
[facility]    CSN: [id number] Arrival date & time: 05/03/20  1345      History   Chief Complaint Chief Complaint  Patient presents with  . Insect Bite    HPI Brandy Lewis is a 59 y.o. female.   59 year old female comes in for 6 day history of rash to the left middle back, now moving to the front. Had itching sensation, now with pain. Denies spreading erythema, warmth. Denies fever, URI symptoms.      Past Medical History:  Diagnosis Date  . Allergy   . Fatty liver   . Gallstones     Patient Active Problem List   Diagnosis Date Noted  . BRBPR (bright red blood per rectum)   . Morbid obesity with body mass index (BMI) greater than or equal to 50 (HCC)   . Rectal pain     Past Surgical History:  Procedure Laterality Date  . BARIATRIC SURGERY    . BIOPSY  08/11/2018   Procedure: BIOPSY;  Surgeon: Mauri Pole, MD;  Location: [facility];  Service: Endoscopy;;  . CHOLECYSTECTOMY    . COLONOSCOPY WITH PROPOFOL [name] 08/11/2018   Procedure: COLONOSCOPY WITH PROPOFOL;  Surgeon: Mauri Pole, MD;  Location: [facility];  Service: Endoscopy;  Laterality: [name];  . DILATION AND CURETTAGE OF UTERUS    . ENDOMETRIAL ABLATION    . GALLBLADDER SURGERY    . GASTRIC BYPASS    . POLYPECTOMY  08/11/2018   Procedure: POLYPECTOMY;  Surgeon: Mauri Pole, MD;  Location: [facility];  Service: Endoscopy;;    OB History   No obstetric history on file.      Home Medications    Prior to Admission medications   Medication Sig Start Date End Date Taking? Authorizing Provider  acetaminophen (TYLENOL) 500 MG tablet Take 1,000 mg by mouth daily as needed for moderate pain or headache.    [provider]  albuterol (VENTOLIN HFA) 108 (90 Base) MCG/ACT inhaler Inhale 2 puffs into the lungs every 6 (six) hours as needed for wheezing or shortness of breath. 02/27/20   Magdalen Spatz, NP  benzonatate (TESSALON) 200 MG capsule Take 1 capsule (200  mg total) by mouth 3 (three) times daily as needed for cough. Patient not taking: Reported on 05/03/2020 02/08/20 02/07/21  Parrett, Fonnie Mu, NP  BIOTIN PO Take 1 tablet by mouth daily.    [provider]  Budesonide (PULMICORT FLEXHALER) 90 MCG/ACT inhaler Inhale 1 puff into the lungs 2 (two) times daily. Patient not taking: Reported on 04/09/2020 01/16/20   June Leap L, DO  Cyanocobalamin (B-12 PO) Take 1 tablet by mouth daily.    [provider]  fexofenadine (ALLEGRA ALLERGY) 180 MG tablet Take 180 mg by mouth daily.     [provider]  FOLIC ACID PO Take 1 tablet by mouth daily.    [provider]  Multiple Vitamins-Minerals (ADULT GUMMY) CHEW Chew 4 tablets by mouth daily.    [provider]  naproxen sodium (ALEVE) 220 MG tablet Take 220 mg by mouth 2 (two) times daily as needed (pain/headache).    [provider]  omeprazole (PRILOSEC OTC) 20 MG tablet Take 20 mg by mouth daily.    [provider]  OVER THE COUNTER MEDICATION Take 1 capsule by mouth daily. diurex otc supplement    [provider]  polyvinyl alcohol (LIQUIFILM TEARS) 1.4 % ophthalmic solution Place 1 drop into both eyes as needed  for dry eyes.    [provider]  valACYclovir (VALTREX) 1000 MG tablet Take 1 tablet (1,000 mg total) by mouth 3 (three) times daily for 7 days. 05/03/20 05/10/20  Ok Edwards, PA-C    Family History Family History  Problem Relation Age of Onset  . Lung cancer Mother   . Diabetes Father   . Heart disease Father     Social History Social History   Tobacco Use  . Smoking status: Passive Smoke Exposure - Never Smoker  . Smokeless tobacco: Never Used  . Tobacco comment: Significant second hand smoke exposure as a child  Vaping Use  . Vaping Use: Never used  Substance Use Topics  . Alcohol use: No  . Drug use: No     Allergies   Diflucan [fluconazole], Latex, and Tape   Review of Systems Review of  Systems  Reason unable to perform ROS: See HPI as above.     Physical Exam Triage Vital Signs ED Triage Vitals  Enc Vitals Group     BP 05/03/20 1355 (!) 191/90     Pulse Rate 05/03/20 1355 75     Resp 05/03/20 1355 18     Temp 05/03/20 1355 97.8 F (36.6 C)     Temp Source 05/03/20 1355 Oral     SpO2 05/03/20 1355 96 %     Weight --      Height --      Head Circumference --      Peak Flow --      Pain Score 05/03/20 1412 9     Pain Loc --      Pain Edu? --      Excl. in [place]? --    No data found.  Updated Vital Signs BP (!) 191/90 (BP Location: Right Arm)   Pulse 75   Temp 97.8 F (36.6 C) (Oral)   Resp 18   SpO2 96%   Physical Exam Constitutional:      General: She is not in acute distress.    Appearance: Normal appearance. She is well-developed. She is not toxic-appearing or diaphoretic.  HENT:     Head: Normocephalic and atraumatic.  Eyes:     Conjunctiva/sclera: Conjunctivae normal.     Pupils: Pupils are equal, round, and reactive to light.  Pulmonary:     Effort: Pulmonary effort is normal. No respiratory distress.  Musculoskeletal:     Cervical back: Normal range of motion and neck supple.  Skin:    General: Skin is warm and dry.     Comments: Vesicular rash along left lateral back extending to below the breast. No erythema, warmth  Neurological:     Mental Status: She is alert and oriented to person, place, and time.      UC Treatments / Results  Labs (all labs ordered are listed, but only abnormal results are displayed) Labs Reviewed - No data to display  EKG   Radiology No results found.  Procedures Procedures (including critical care time)  Medications Ordered in UC Medications - No data to display  Initial Impression / Assessment and Plan / UC Course  I have reviewed the triage vital signs and the nursing notes.  Pertinent labs & imaging results that were available during my care of the patient were reviewed by me and considered  in my medical decision making (see chart for details).    Start valtrex as directed. Continue symptomatic treatment. To follow up with PCP for recheck in 7 days.  Return precautions given.  Final Clinical Impressions(s) / UC Diagnoses   Final diagnoses:  Herpes zoster without complication   ED Prescriptions    Medication Sig Dispense Auth. Provider   valACYclovir (VALTREX) 1000 MG tablet Take 1 tablet (1,000 mg total) by mouth 3 (three) times daily for 7 days. 21 tablet Ok Edwards, PA-C     I have reviewed the PDMP during this encounter.   Ok Edwards, PA-C 05/03/20 1451

## 2020-05-03 NOTE — ED Triage Notes (Signed)
Pt sts painful rash to middle left back x 6 days now coming around to front and around back

## 2020-05-05 DIAGNOSIS — B028 Zoster with other complications: Secondary | ICD-10-CM | POA: Diagnosis not present

## 2020-08-14 ENCOUNTER — Telehealth: Payer: Self-pay | Admitting: Pulmonary Disease

## 2020-08-14 NOTE — Telephone Encounter (Signed)
Called and spoke to pt. Informed her of the recs per Dr. Valeta Harms. Pt was given information for [facility] Covid testing. Will keep encounter open to follow up on COVID test.

## 2020-08-14 NOTE — Telephone Encounter (Signed)
PCCM:  If patient is not vaccinated and has URI symptoms she should receive a COVID-19 PCR test to determine if she has this before proceeding further. Otherwise, can schedule televisit to discuss with one of our providers.   Garner Nash, DO [facility] 08/14/2020 10:39 AM

## 2020-08-14 NOTE — Telephone Encounter (Signed)
Called and spoke to pt. Pt c/o sinus congestion, PND, blowing out yellow mucus from nose, chest congestion, non prod cough, increase in SOB only when active. Pt states s/s started Monday 11/15. Pt denies CP/tightess, f/c/s, swelling. Pt taking OTC mucinex and airborne. Pt is getting covid vaccinated on 11/29. Pt states she was told by Eric Form, NP, at last appt that when these symptoms appear during this season to call for abx.   Dr. Valeta Harms please advise. Thanks.

## 2020-08-15 DIAGNOSIS — J209 Acute bronchitis, unspecified: Secondary | ICD-10-CM | POA: Diagnosis not present

## 2020-08-15 DIAGNOSIS — J0100 Acute maxillary sinusitis, unspecified: Secondary | ICD-10-CM | POA: Diagnosis not present

## 2020-08-15 NOTE — Telephone Encounter (Signed)
Per chart pt has not been Covid tested. No covid test currently scheduled in chart. atc pt to see if she has been tested out of health system, but line rang to fast busy signal.  Wcb.

## 2020-08-26 NOTE — Telephone Encounter (Signed)
Called and spoke to pt. Pt states she called her PCP and was given a steroid injection and is feeling much better. Pt denied getting a covid test. Pt states she has had her first covid vaccination. Pt denied any further questions or concerns. Will sign off.

## 2021-01-02 DIAGNOSIS — R0789 Other chest pain: Secondary | ICD-10-CM | POA: Diagnosis not present

## 2021-01-02 DIAGNOSIS — R070 Pain in throat: Secondary | ICD-10-CM | POA: Diagnosis not present

## 2021-01-02 DIAGNOSIS — R0981 Nasal congestion: Secondary | ICD-10-CM | POA: Diagnosis not present

## 2021-01-02 DIAGNOSIS — E669 Obesity, unspecified: Secondary | ICD-10-CM | POA: Diagnosis not present

## 2021-01-02 DIAGNOSIS — Z20828 Contact with and (suspected) exposure to other viral communicable diseases: Secondary | ICD-10-CM | POA: Diagnosis not present

## 2021-02-24 ENCOUNTER — Inpatient Hospital Stay: Admission: RE | Admit: 2021-02-24 | Payer: BC Managed Care – PPO | Source: Ambulatory Visit

## 2021-02-26 ENCOUNTER — Telehealth: Payer: Self-pay | Admitting: Pulmonary Disease

## 2021-02-27 NOTE — Telephone Encounter (Signed)
Working on this one.  

## 2021-03-03 NOTE — Telephone Encounter (Signed)
Spoke to Salome Spotted to obtain PA for this patient.  Pt's insurance is so new, they did not have her information updated from [place] at this time.  Mardene Celeste was able to initiate the prior auth & asked me to fax records to [phone] using CASE# [id number].  Pt has been made aware/

## 2021-03-09 ENCOUNTER — Other Ambulatory Visit: Payer: Self-pay

## 2021-03-09 ENCOUNTER — Ambulatory Visit (INDEPENDENT_AMBULATORY_CARE_PROVIDER_SITE_OTHER)
Admission: RE | Admit: 2021-03-09 | Discharge: 2021-03-09 | Disposition: A | Discharge status: Home / Self Care | Payer: Managed Care, Other (non HMO) | Source: Ambulatory Visit | Attending: Acute Care | Admitting: Acute Care

## 2021-03-09 DIAGNOSIS — R911 Solitary pulmonary nodule: Secondary | ICD-10-CM | POA: Diagnosis not present

## 2021-03-11 ENCOUNTER — Telehealth: Payer: Self-pay | Admitting: Pulmonary Disease

## 2021-03-11 NOTE — Telephone Encounter (Signed)
See result note.  

## 2021-03-11 NOTE — Progress Notes (Signed)
Patient returned call, called and spoke with patient.  Advised of results/recommendations per TP.  Patient verbalized understanding.  Scheduled f/u visit to see SG.  Nothing further needed.

## 2021-03-11 NOTE — Progress Notes (Signed)
ATC x1, LVM to return call regarding CT scan.

## 2021-03-25 ENCOUNTER — Encounter: Payer: Self-pay | Admitting: Acute Care

## 2021-03-25 ENCOUNTER — Other Ambulatory Visit: Payer: Self-pay

## 2021-03-25 ENCOUNTER — Ambulatory Visit (INDEPENDENT_AMBULATORY_CARE_PROVIDER_SITE_OTHER): Payer: Managed Care, Other (non HMO) | Admitting: Acute Care

## 2021-03-25 VITALS — BP 132/84 | HR 84 | Temp 97.9°F | Ht 62.0 in | Wt 298.4 lb

## 2021-03-25 DIAGNOSIS — R911 Solitary pulmonary nodule: Secondary | ICD-10-CM

## 2021-03-25 DIAGNOSIS — T50905A Adverse effect of unspecified drugs, medicaments and biological substances, initial encounter: Secondary | ICD-10-CM | POA: Diagnosis not present

## 2021-03-25 NOTE — Patient Instructions (Addendum)
It is good to see you today. We will schedule a 12 month follow up Low Dose CT to monitor these nodules for another year. Please send a message through My Chart with the name of the medication you developed a rash while taking. We need to document this as an allergy.  Follow up in 12 months or sooner if you need [place].  Please contact office for sooner follow up if symptoms do not improve or worsen or seek emergency care

## 2021-03-25 NOTE — Progress Notes (Signed)
History of Present Illness Brandy Lewis is a 60 y.o. female never smoker referred for abnormal nodules on CT Scan. She is followed by Dr. Valeta Harms.  Synopsis 60 year old never smoker with incidental finding of pulmonary nodule on a renal CT done in the ED 12/06/2019. This   revealed a lower chest 5 mm pulmonary nodule as well as subtle groundglass opacity within the left lung base.  Patient was referred to pulmonary for further evaluation.   After consult with Dr. Valeta Harms a Dedicated CT Chest done 01/31/2020 showed a Stable 5 mm right lower lobe and 5 mm left lower lobe nodules.with recommendation of 12 month follow up to ensure stability.    03/25/2021 Pt. Presents for follow up. CT Chest done 03/09/2021 shows stable small bilateral lower lobe nodules, no masses of adenopathy. This was a follow up scan to better evaluate an incidental nodule . She is in good health. She has no complaints. She is relieved her CT Chest was stable. Her mother died of lung cancer at 16, and the patient has a history of significant second hand smoke.  We will plan CT in 12 months  to reassess for stability. She knows to call sooner for    Test Results: CT Chest 03/09/2021 Stable small bilateral lower lobe pulmonary nodules. No new pulmonary nodules or worrisome pulmonary lesions. Recommend follow-up noncontrast chest CT in 12 months to confer 2 years of stability. 2. No mediastinal or hilar mass or adenopathy. 3. Stable coronary artery calcifications. 4. Surgical changes from gastric bypass surgery. No complicating features. 5. Aortic atherosclerosis.  CT Chest 01/31/2020 Stable 5 mm right lower lobe and 5 mm left lower lobe nodules. Non-contrast chest CT recommended in 12 months   Renal CT 12/06/2019 5 mm pulmonary nodule within the RIGHT lower lobe. No follow-up needed if patient is low-risk. Non-contrast chest CT can be considered in 12 months if patient is high-risk    CBC Latest Ref Rng & Units 12/06/2019  10/23/2008  WBC 4.0 - 10.5 K/uL 7.7 7.2  Hemoglobin 12.0 - 15.0 g/dL 14.1 14.6  Hematocrit 36.0 - 46.0 % 43.8 43.0  Platelets 150 - 400 K/uL 303 247    BMP Latest Ref Rng & Units 12/06/2019 10/23/2008  Glucose 70 - 99 mg/dL 114(H) 91  BUN 6 - 20 mg/dL 21(H) 9  Creatinine 0.44 - 1.00 mg/dL 0.87 0.69  Sodium 135 - 145 mmol/L 141 134(L)  Potassium 3.5 - 5.1 mmol/L 4.2 3.3(L)  Chloride 98 - 111 mmol/L 110 101  CO2 22 - 32 mmol/L 22 27  Calcium 8.9 - 10.3 mg/dL 9.1 8.8    BNP No results found for: BNP  ProBNP No results found for: PROBNP  PFT    Component Value Date/Time   FEV1PRE 2.55 04/04/2020 1301   FEV1POST 2.61 04/04/2020 1301   FVCPRE 3.15 04/04/2020 1301   FVCPOST 3.09 04/04/2020 1301   TLC 4.89 04/04/2020 1301   DLCOUNC 27.83 04/04/2020 1301   PREFEV1FVCRT 81 04/04/2020 1301   PSTFEV1FVCRT 84 04/04/2020 1301    CT Chest Wo Contrast  Result Date: 03/10/2021 CLINICAL DATA:  Followup pulmonary nodules. EXAM: CT CHEST WITHOUT CONTRAST TECHNIQUE: Multidetector CT imaging of the chest was performed following the standard protocol without IV contrast. COMPARISON:  Chest CT 01/31/2020 FINDINGS: Cardiovascular: The heart is normal in size. No pericardial effusion. The aorta is normal in caliber. No atherosclerotic calcifications. Branch vessels appear normal. There are scattered coronary artery calcifications which are stable. Mediastinum/Nodes: No mediastinal  or hilar mass or lymphadenopathy. The esophagus is unremarkable. The thyroid gland is unremarkable. Lungs/Pleura: No acute pulmonary findings. No infiltrates, edema or effusions. No interstitial lung disease or bronchiectasis. Stable 3 mm right lower lobe pulmonary nodule on image 117/3. Stable 4 mm left lower lobe pulmonary nodule on image number 99/3. No new pulmonary nodules or worrisome pulmonary lesions. Upper Abdomen: Surgical changes from gastric bypass surgery. No complicating features. The gallbladder is surgically  absent. No upper abdominal mass or adenopathy. Musculoskeletal: No breast masses, supraclavicular or axillary adenopathy. The bony thorax is intact. IMPRESSION: 1. Stable small bilateral lower lobe pulmonary nodules. No new pulmonary nodules or worrisome pulmonary lesions. Recommend follow-up noncontrast chest CT in 12 months to confer 2 years of stability. 2. No mediastinal or hilar mass or adenopathy. 3. Stable coronary artery calcifications. 4. Surgical changes from gastric bypass surgery. No complicating features. 5. Aortic atherosclerosis. Aortic Atherosclerosis (ICD10-I70.0). Electronically Signed   By: Marijo Sanes M.D.   On: 03/10/2021 08:19     Past medical hx Past Medical History:  Diagnosis Date   Allergy    Fatty liver    Gallstones      Social History   Tobacco Use   Smoking status: Never    Passive exposure: Yes   Smokeless tobacco: Never   Tobacco comments:    Significant second hand smoke exposure as a child  Vaping Use   Vaping Use: Never used  Substance Use Topics   Alcohol use: No   Drug use: No    Brandy Lewis reports that she has never smoked. She has been exposed to tobacco smoke. She has never used smokeless tobacco. She reports that she does not drink alcohol and does not use drugs.  Tobacco Cessation: Never smoker Second hand smoke exposure as a child  Past surgical hx, Family hx, Social hx all reviewed.  Current Outpatient Medications on File Prior to Visit  Medication Sig   BIOTIN PO Take 1 tablet by mouth daily.   Cyanocobalamin (B-12 PO) Take 1 tablet by mouth daily.   fexofenadine (ALLEGRA ALLERGY) 180 MG tablet Take 180 mg by mouth daily.    FOLIC ACID PO Take 1 tablet by mouth daily.   Multiple Vitamins-Minerals (ADULT GUMMY) CHEW Chew 4 tablets by mouth daily.   omeprazole (PRILOSEC OTC) 20 MG tablet Take 20 mg by mouth daily.   OVER THE COUNTER MEDICATION Take 1 capsule by mouth daily. diurex otc supplement   No current facility-administered  medications on file prior to visit.     Allergies  Allergen Reactions   Diflucan [Fluconazole]     Causes yeast infections    Latex Rash   Tape Rash    Review Of Systems:  Constitutional:   No  weight loss, night sweats,  Fevers, chills, fatigue, or  lassitude.  HEENT:   No headaches,  Difficulty swallowing,  Tooth/dental problems, or  Sore throat,                No sneezing, itching, ear ache, nasal congestion, post nasal drip,   CV:  No chest pain,  Orthopnea, PND, swelling in lower extremities, anasarca, dizziness, palpitations, syncope.   GI  No heartburn, indigestion, abdominal pain, nausea, vomiting, diarrhea, change in bowel habits, loss of appetite, bloody stools.   Resp: No shortness of breath with exertion or at rest.  No excess mucus, no productive cough,  No non-productive cough,  No coughing up of blood.  No change in color of mucus.  No wheezing.  No chest wall deformity  Skin: no rash or lesions.  GU: no dysuria, change in color of urine, no urgency or frequency.  No flank pain, no hematuria   MS:  No joint pain or swelling.  No decreased range of motion.  No back pain.  Psych:  No change in mood or affect. No depression or anxiety.  No memory loss.   Vital Signs BP 132/84 (BP Location: Left Wrist, Cuff Size: Normal)   Pulse 84   Temp 97.9 F (36.6 C) (Oral)   Ht 5\' 2"  (1.575 m)   Wt 298 lb 6.4 oz (135.4 kg)   SpO2 98%   BMI 54.58 kg/m    Physical Exam:  General- No distress,  A&Ox3, pleasant ENT: No sinus tenderness, TM clear, pale nasal mucosa, no oral exudate,no post nasal drip, no LAN Cardiac: S1, S2, regular rate and rhythm, no murmur Chest: No wheeze/ rales/ dullness; no accessory muscle use, no nasal flaring, no sternal retractions Abd.: Soft Non-tender, ND, BS +, Body mass index is 54.58 kg/m.  Ext: No clubbing cyanosis, edema Neuro:  normal strength, MAEx 4, A&O x 3 Skin: No rashes, warm and dry Psych: normal mood and  behavior   Assessment/Plan Pulmonary Nodules ( incidental findings) Stable on recent CT Chest 03/09/2021 Plan We will schedule a 12 month follow up Low Dose CT to monitor these nodules for another year. Follow up in 12 months or sooner if you need [place].  Please contact office for sooner follow up if symptoms do not improve or worsen or seek emergency care    Allergic reaction to medication prescribed by another provider Skin rash with sluffing Cannot remember name of medication Not currently listed as an allergy Plan Please send a message through My Chart with the name of the medication you developed a rash to while taking. We need to document this as an allergy.   I spent 30  minutes dedicated to the care of this patient on the date of this encounter to include pre-visit review of records, face-to-face time with the patient discussing conditions above, post visit ordering of testing, clinical documentation with the electronic health record, making appropriate referrals as documented, and communicating necessary information to the patient's healthcare team.    Magdalen Spatz, NP 03/25/2021  4:26 PM

## 2021-03-27 ENCOUNTER — Encounter: Payer: Self-pay | Admitting: Acute Care

## 2021-03-31 NOTE — Progress Notes (Signed)
PCCM: thanks for seeing her  Garner Nash, DO [facility] 03/31/2021 8:27 PM

## 2021-04-22 ENCOUNTER — Other Ambulatory Visit: Payer: Self-pay | Admitting: Obstetrics and Gynecology

## 2021-04-22 DIAGNOSIS — E041 Nontoxic single thyroid nodule: Secondary | ICD-10-CM

## 2021-04-23 ENCOUNTER — Other Ambulatory Visit: Payer: Self-pay

## 2021-04-23 ENCOUNTER — Ambulatory Visit
Admission: RE | Admit: 2021-04-23 | Discharge: 2021-04-23 | Disposition: A | Discharge status: Home / Self Care | Payer: Managed Care, Other (non HMO) | Source: Ambulatory Visit | Attending: Obstetrics and Gynecology | Admitting: Obstetrics and Gynecology

## 2021-04-23 DIAGNOSIS — E041 Nontoxic single thyroid nodule: Secondary | ICD-10-CM

## 2021-07-09 ENCOUNTER — Other Ambulatory Visit (HOSPITAL_COMMUNITY): Payer: Self-pay | Admitting: Otolaryngology

## 2021-07-09 ENCOUNTER — Other Ambulatory Visit: Payer: Self-pay | Admitting: Otolaryngology

## 2021-07-09 DIAGNOSIS — R1310 Dysphagia, unspecified: Secondary | ICD-10-CM

## 2021-07-21 ENCOUNTER — Encounter (HOSPITAL_COMMUNITY): Payer: Self-pay

## 2021-07-21 ENCOUNTER — Ambulatory Visit (HOSPITAL_COMMUNITY): Payer: Managed Care, Other (non HMO)

## 2021-07-23 ENCOUNTER — Other Ambulatory Visit: Payer: Self-pay

## 2021-07-23 ENCOUNTER — Ambulatory Visit (HOSPITAL_COMMUNITY)
Admission: RE | Admit: 2021-07-23 | Discharge: 2021-07-23 | Disposition: A | Discharge status: Home / Self Care | Payer: Managed Care, Other (non HMO) | Source: Ambulatory Visit | Attending: Otolaryngology | Admitting: Otolaryngology

## 2021-07-23 DIAGNOSIS — R1310 Dysphagia, unspecified: Secondary | ICD-10-CM | POA: Insufficient documentation

## 2021-11-10 ENCOUNTER — Encounter: Payer: Self-pay | Admitting: Gastroenterology

## 2021-11-10 ENCOUNTER — Other Ambulatory Visit (HOSPITAL_COMMUNITY): Payer: Self-pay

## 2021-11-10 ENCOUNTER — Ambulatory Visit (INDEPENDENT_AMBULATORY_CARE_PROVIDER_SITE_OTHER): Payer: Managed Care, Other (non HMO) | Admitting: Gastroenterology

## 2021-11-10 VITALS — BP 154/94 | HR 64 | Ht 62.0 in | Wt 290.0 lb

## 2021-11-10 DIAGNOSIS — K649 Unspecified hemorrhoids: Secondary | ICD-10-CM

## 2021-11-10 DIAGNOSIS — R1310 Dysphagia, unspecified: Secondary | ICD-10-CM

## 2021-11-10 DIAGNOSIS — K219 Gastro-esophageal reflux disease without esophagitis: Secondary | ICD-10-CM

## 2021-11-10 DIAGNOSIS — Z9884 Bariatric surgery status: Secondary | ICD-10-CM | POA: Diagnosis not present

## 2021-11-10 MED ORDER — FAMOTIDINE 40 MG PO TABS: 40.0000 mg | ORAL_TABLET | Freq: Every day | ORAL | 3 refills | Status: DC

## 2021-11-10 MED ORDER — ESOMEPRAZOLE MAGNESIUM 40 MG PO CPDR: 40.0000 mg | DELAYED_RELEASE_CAPSULE | Freq: Every day | ORAL | 1 refills | Status: DC

## 2021-11-10 MED ORDER — HYDROCORTISONE ACETATE 25 MG RE SUPP: 25.0000 mg | Freq: Every evening | RECTAL | 1 refills | Status: DC | PRN

## 2021-11-10 MED ORDER — ESOMEPRAZOLE MAGNESIUM 40 MG PO CPDR: 40.0000 mg | DELAYED_RELEASE_CAPSULE | Freq: Every day | ORAL | 3 refills | Status: DC

## 2021-11-10 MED ORDER — HYDROCORTISONE ACETATE 25 MG RE SUPP: 25.0000 mg | Freq: Every evening | RECTAL | 0 refills | Status: DC | PRN

## 2021-11-10 NOTE — Patient Instructions (Addendum)
You have been scheduled for a modified barium swallow on 11/25/2021 at 1:00pm. Please arrive 15 minutes prior to your test for registration. You will go to  Radiology (1st Floor) for your appointment. Should you need to cancel or reschedule your appointment, please contact [phone] Brandy Lewis) or [phone] Brandy Lewis). _____________________________________________________________________ A Modified Barium Swallow Study, or MBS, is a special x-ray that is taken to check swallowing skills. It is carried out by a Stage manager and a Psychologist, clinical (SLP). During this test, yourmouth, throat, and esophagus, a muscular tube which connects your mouth to your stomach, is checked. The test will help you, your doctor, and the SLP plan what types of foods and liquids are easier for you to swallow. The SLP will also identify positions and ways to help you swallow more easily and safely. What will happen during an MBS? You will be taken to an x-ray room and seated comfortably. You will be asked to swallow small amounts of food and liquid mixed with barium. Barium is a liquid or paste that allows images of your mouth, throat and esophagus to be seen on x-ray. The x-ray captures moving images of the food you are swallowing as it travels from your mouth through your throat and into your esophagus. This test helps identify whether food or liquid is entering your lungs (aspiration). The test also shows which part of your mouth or throat lacks strength or coordination to move the food or liquid in the right direction. This test typically takes 30 minutes to 1 hour to complete. _______________________________________________________________________   Gastroesophageal Reflux Disease, Adult Gastroesophageal reflux (GER) happens when acid from the stomach flows up into the tube that connects the mouth and the stomach (esophagus). Normally, food travels down the esophagus and stays in the stomach to be  digested. However, when a person has GER, food and stomach acid sometimes move back up into the esophagus. If this becomes a more serious problem, the person may be diagnosed with a disease called gastroesophageal reflux disease (GERD). GERD occurs when the reflux: Happens often. Causes frequent or severe symptoms. Causes problems such as damage to the esophagus. When stomach acid comes in contact with the esophagus, the acid may cause inflammation in the esophagus. Over time, GERD may create small holes (ulcers) in the lining of the esophagus. What are the causes? This condition is caused by a problem with the muscle between the esophagus and the stomach (lower esophageal sphincter, or LES). Normally, the LES muscle closes after food passes through the esophagus to the stomach. When the LES is weakened or abnormal, it does not close properly, and that allows food and stomach acid to go back up into the esophagus. The LES can be weakened by certain dietary substances, medicines, and medical conditions, including: Tobacco use. Pregnancy. Having a hiatal hernia. Alcohol use. Certain foods and beverages, such as coffee, chocolate, onions, and peppermint. What increases the risk? You are more likely to develop this condition if you: Have an increased body weight. Have a connective tissue disorder. Take NSAIDs, such as ibuprofen. What are the signs or symptoms? Symptoms of this condition include: Heartburn. Difficult or painful swallowing and the feeling of having a lump in the throat. A bitter taste in the mouth. Bad breath and having a large amount of saliva. Having an upset or bloated stomach and belching. Chest pain. Different conditions can cause chest pain. Make sure you see your health care provider if you experience chest pain. Shortness of breath  or wheezing. Ongoing (chronic) cough or a nighttime cough. Wearing away of tooth enamel. Weight loss. How is this diagnosed? This  condition may be diagnosed based on a medical history and a physical exam. To determine if you have mild or severe GERD, your health care provider may also monitor how you respond to treatment. You may also have tests, including: A test to examine your stomach and esophagus with a small camera (endoscopy). A test that measures the acidity level in your esophagus. A test that measures how much pressure is on your esophagus. A barium swallow or modified barium swallow test to show the shape, size, and functioning of your esophagus. How is this treated? Treatment for this condition may vary depending on how severe your symptoms are. Your health care provider may recommend: Changes to your diet. Medicine. Surgery. The goal of treatment is to help relieve your symptoms and to prevent complications. Follow these instructions at home: Eating and drinking  Follow a diet as recommended by your health care provider. This may involve avoiding foods and drinks such as: Coffee and tea, with or without caffeine. Drinks that contain alcohol. Energy drinks and sports drinks. Carbonated drinks or sodas. Chocolate and cocoa. Peppermint and mint flavorings. Garlic and onions. Horseradish. Spicy and acidic foods, including peppers, chili powder, curry powder, vinegar, hot sauces, and barbecue sauce. Citrus fruit juices and citrus fruits, such as oranges, lemons, and limes. Tomato-based foods, such as red sauce, chili, salsa, and pizza with red sauce. Fried and fatty foods, such as donuts, french fries, potato chips, and high-fat dressings. High-fat meats, such as hot dogs and fatty cuts of red and white meats, such as rib eye steak, sausage, ham, and bacon. High-fat dairy items, such as whole milk, butter, and cream cheese. Eat small, frequent meals instead of large meals. Avoid drinking large amounts of liquid with your meals. Avoid eating meals during the 2-3 hours before bedtime. Avoid lying down  right after you eat. Do not exercise right after you eat. Lifestyle  Do not use any products that contain nicotine or tobacco. These products include cigarettes, chewing tobacco, and vaping devices, such as e-cigarettes. If you need help quitting, ask your health care provider. Try to reduce your stress by using methods such as yoga or meditation. If you need help reducing stress, ask your health care provider. If you are overweight, reduce your weight to an amount that is healthy for you. Ask your health care provider for guidance about a safe weight loss goal. General instructions Pay attention to any changes in your symptoms. Take over-the-counter and prescription medicines only as told by your health care provider. Do not take aspirin, ibuprofen, or other NSAIDs unless your health care provider told you to take these medicines. Wear loose-fitting clothing. Do not wear anything tight around your waist that causes pressure on your abdomen. Raise (elevate) the head of your bed about 6 inches (15 cm). You can use a wedge to do this. Avoid bending over if this makes your symptoms worse. Keep all follow-up visits. This is important. Contact a health care provider if: You have: New symptoms. Unexplained weight loss. Difficulty swallowing or it hurts to swallow. Wheezing or a persistent cough. A hoarse voice. Your symptoms do not improve with treatment. Get help right away if: You have sudden pain in your arms, neck, jaw, teeth, or back. You suddenly feel sweaty, dizzy, or light-headed. You have chest pain or shortness of breath. You vomit and the vomit is  green, yellow, or black, or it looks like blood or coffee grounds. You faint. You have stool that is red, bloody, or black. You cannot swallow, drink, or eat. These symptoms may represent a serious problem that is an emergency. Do not wait to see if the symptoms will go away. Get medical help right away. Call your local emergency services  (911 in the [place] Do not drive yourself to the hospital. Summary Gastroesophageal reflux happens when acid from the stomach flows up into the esophagus. GERD is a disease in which the reflux happens often, causes frequent or severe symptoms, or causes problems such as damage to the esophagus. Treatment for this condition may vary depending on how severe your symptoms are. Your health care provider may recommend diet and lifestyle changes, medicine, or surgery. Contact a health care provider if you have new or worsening symptoms. Take over-the-counter and prescription medicines only as told by your health care provider. Do not take aspirin, ibuprofen, or other NSAIDs unless your health care provider told you to do so. Keep all follow-up visits as told by your health care provider. This is important. This information is not intended to replace advice given to you by your health care provider. Make sure you discuss any questions you have with your health care provider. Document Revised: 03/24/2020 Document Reviewed: 03/24/2020 Elsevier Patient Education  2022 [employer].  Follow up in 3 months   Due to recent changes in healthcare laws, you may see the results of your imaging and laboratory studies on MyChart before your provider has had a chance to review them.  We understand that in some cases there may be results that are confusing or concerning to you. Not all laboratory results come back in the same time frame and the provider may be waiting for multiple results in order to interpret others.  Please give [place] 48 hours in order for your provider to thoroughly review all the results before contacting the office for clarification of your results.    Thank you for choosing [facility]  Karleen Hampshire Nandigam,MD

## 2021-11-10 NOTE — Progress Notes (Signed)
Brandy Lewis    [id number]    [birth year]  Primary Care Physician:Slatosky, Marshall Cork., MD  Referring Physician: Leta Baptist, [place] [address] [place],  [place] [address]   Chief complaint: Dysphagia, BRNPR, hemorrhoids  HPI: 61 year old very pleasant female here with complaints of dysphagia, heartburn and hoarseness of voice.  She was last seen in November 2019 She is experiencing increased heartburn, hoarseness of voice and intermittent dysphagia.  She was on omeprazole 20 mg daily, switch to Nexium 20 mg daily with no significant improvement  She had barium esophagram that was negative for any mass lesion or esophageal strictures  She has constant postnasal drip, is on antihistamine twice a day  She is s/p gastric bypass surgery and cholecystectomy  She complains of intermittent bright red blood per rectum from hemorrhoids, also has skin tags that she feels are getting worse, is interested in seeing colorectal surgery to get them removed.  Barium esophagram July 23, 2021: Nonspecific esophageal motility disorder otherwise unremarkable exam.  30 mm barium tablet passes readily into the stomach  Ultrasound thyroid April 23, 2021 Parenchymal Echotexture: Mildly heterogeneous   Isthmus: 0.3 cm   Right lobe: 4.1 x 1.2 x 1.6 cm   Left lobe: 4.2 x 1.3 x 1.5 cm'  CT chest without contrast March 09, 2021: 1. Stable small bilateral lower lobe pulmonary nodules. No new pulmonary nodules or worrisome pulmonary lesions. Recommend follow-up noncontrast chest CT in 12 months to confer 2 years of stability. 2. No mediastinal or hilar mass or adenopathy. 3. Stable coronary artery calcifications. 4. Surgical changes from gastric bypass surgery. No complicating features. 5. Aortic atherosclerosis.  Colonoscopy 08/11/2018 - One 2 mm polyp in the ascending colon, removed with a cold biopsy forceps. Resected and retrieved. - Anal papilla(e) were hypertrophied.  Biopsied. - Diverticulosis in the sigmoid colon. - Non-bleeding internal hemorrhoids.  Outpatient Encounter Medications as of 11/10/2021  Medication Sig   BIOTIN PO Take 1 tablet by mouth daily.   Cyanocobalamin (B-12 PO) Take 1 tablet by mouth daily.   fexofenadine (ALLEGRA ALLERGY) 180 MG tablet Take 180 mg by mouth daily.    FOLIC ACID PO Take 1 tablet by mouth daily.   Multiple Vitamins-Minerals (ADULT GUMMY) CHEW Chew 4 tablets by mouth daily.   omeprazole (PRILOSEC OTC) 20 MG tablet Take 20 mg by mouth daily.   OVER THE COUNTER MEDICATION Take 1 capsule by mouth daily. diurex otc supplement   No facility-administered encounter medications on file as of 11/10/2021.    Allergies as of 11/10/2021 - Review Complete 03/27/2021  Allergen Reaction Noted   Molnupiravir Hives 03/26/2021   Diflucan [fluconazole]  09/28/2017   Latex Rash 09/28/2017   Tape Rash 07/18/2018    Past Medical History:  Diagnosis Date   Allergy    Fatty liver    Gallstones     Past Surgical History:  Procedure Laterality Date   BARIATRIC SURGERY     BIOPSY  08/11/2018   Procedure: BIOPSY;  Surgeon: Mauri Pole, MD;  Location: [facility];  Service: Endoscopy;;   CHOLECYSTECTOMY     COLONOSCOPY WITH PROPOFOL [name] 08/11/2018   Procedure: COLONOSCOPY WITH PROPOFOL;  Surgeon: Mauri Pole, MD;  Location: [facility];  Service: Endoscopy;  Laterality: [name];   DILATION AND CURETTAGE OF UTERUS     ENDOMETRIAL ABLATION     GALLBLADDER SURGERY     GASTRIC BYPASS     POLYPECTOMY  08/11/2018   Procedure: POLYPECTOMY;  Surgeon: Mauri Pole, MD;  Location: [facility];  Service: Endoscopy;;    Family History  Problem Relation Age of Onset   Lung cancer Mother    Diabetes Father    Heart disease Father     Social History   Socioeconomic History   Marital status: Married    Spouse name: Elta Guadeloupe   Number of children: 1   Years of education: Not on file   Highest education level:  Not on file  Occupational History   Not on file  Tobacco Use   Smoking status: Never    Passive exposure: Yes   Smokeless tobacco: Never   Tobacco comments:    Significant second hand smoke exposure as a child  Vaping Use   Vaping Use: Never used  Substance and Sexual Activity   Alcohol use: No   Drug use: No   Sexual activity: Yes  Other Topics Concern   Not on file  Social History Narrative   Not on file   Social Determinants of Health   Financial Resource Strain: Not on file  Food Insecurity: Not on file  Transportation Needs: Not on file  Physical Activity: Not on file  Stress: Not on file  Social Connections: Not on file  Intimate Partner Violence: Not on file      Review of systems: All other review of systems negative except as mentioned in the HPI.   Physical Exam: Vitals:   11/10/21 0812  BP: (!) 154/94  Pulse: 64   Body mass index is 53.04 kg/m. Gen:      No acute distress HEENT:  sclera anicteric Abd:      soft, non-tender; no palpable masses, no distension Ext:    No edema Neuro: alert and oriented x 3 Psych: normal mood and affect  Data Reviewed:  Reviewed labs, radiology imaging, old records and pertinent past GI work up   Assessment and Plan/Recommendations:  61 year old very pleasant female s/p gastric bypass surgery, morbid obesity BMI > 50 with complaints of worsening reflux symptoms and dysphagia  Barium swallow unremarkable for esophageal stricture or mass lesion We will obtain modified barium swallow to exclude oropharyngeal dysphagia  GERD: Increase Nexium to 40 mg daily, 30 minutes before breakfast and add famotidine 40 mg daily at bedtime Antireflux measures  If continues to have persistent symptoms, will plan for EGD with 48-hour pH Bravo study to evaluate  Symptomatic hemorrhoids: Use Anusol suppository per rectum as needed Avoid excessive straining Follow-up with colorectal surgery for management of hemorrhoids and  hypertrophied anal papilla  Return in 2 to 3 months  This visit required 45 minutes of patient care (this includes precharting, chart review, review of results, face-to-face time used for counseling as well as treatment plan and follow-up. The patient was provided an opportunity to ask questions and all were answered. The patient agreed with the plan and demonstrated an understanding of the instructions.  Damaris Hippo , MD    CC: Leta Baptist, MD

## 2021-11-25 ENCOUNTER — Other Ambulatory Visit: Payer: Self-pay

## 2021-11-25 ENCOUNTER — Ambulatory Visit (HOSPITAL_COMMUNITY)
Admission: RE | Admit: 2021-11-25 | Discharge: 2021-11-25 | Disposition: A | Discharge status: Home / Self Care | Payer: Managed Care, Other (non HMO) | Source: Ambulatory Visit | Attending: Gastroenterology | Admitting: Gastroenterology

## 2021-11-25 DIAGNOSIS — R1310 Dysphagia, unspecified: Secondary | ICD-10-CM | POA: Diagnosis not present

## 2021-11-25 DIAGNOSIS — K219 Gastro-esophageal reflux disease without esophagitis: Secondary | ICD-10-CM

## 2022-03-01 ENCOUNTER — Other Ambulatory Visit: Payer: Managed Care, Other (non HMO)

## 2022-03-02 ENCOUNTER — Ambulatory Visit (INDEPENDENT_AMBULATORY_CARE_PROVIDER_SITE_OTHER)
Admission: RE | Admit: 2022-03-02 | Discharge: 2022-03-02 | Disposition: A | Discharge status: Home / Self Care | Payer: BC Managed Care – PPO | Source: Ambulatory Visit | Attending: Pulmonary Disease | Admitting: Pulmonary Disease

## 2022-03-02 DIAGNOSIS — R911 Solitary pulmonary nodule: Secondary | ICD-10-CM | POA: Diagnosis not present

## 2022-03-09 ENCOUNTER — Telehealth: Payer: Self-pay | Admitting: Gastroenterology

## 2022-03-09 MED ORDER — ESOMEPRAZOLE MAGNESIUM 40 MG PO CPDR: 40.0000 mg | DELAYED_RELEASE_CAPSULE | Freq: Every day | ORAL | 1 refills | Status: DC

## 2022-03-09 MED ORDER — FAMOTIDINE 40 MG PO TABS: 40.0000 mg | ORAL_TABLET | Freq: Every day | ORAL | 3 refills | Status: DC

## 2022-03-09 NOTE — Progress Notes (Signed)
Ty, please let patient know that her nodules are stable and no additional follow up is needed.   Thanks,  BLI  Garner Nash, DO [facility] 03/09/2022 2:16 PM

## 2022-03-09 NOTE — Telephone Encounter (Signed)
Medicines refilled as requested

## 2022-03-17 ENCOUNTER — Telehealth: Payer: Self-pay | Admitting: Acute Care

## 2022-03-17 NOTE — Telephone Encounter (Signed)
I have called the patient with the results of her CT Chest. We discussed that the lung nodules are stable and she does not need any further follow up per Dr. Fabio Bering note dated 03/02/2022. She verbalized understanding and had no further questions at completion of the call.

## 2022-07-24 ENCOUNTER — Emergency Department (HOSPITAL_BASED_OUTPATIENT_CLINIC_OR_DEPARTMENT_OTHER)
Admission: EM | Admit: 2022-07-24 | Discharge: 2022-07-24 | Disposition: A | Discharge status: Home / Self Care | Payer: BC Managed Care – PPO | Attending: Emergency Medicine | Admitting: Emergency Medicine

## 2022-07-24 ENCOUNTER — Encounter (HOSPITAL_BASED_OUTPATIENT_CLINIC_OR_DEPARTMENT_OTHER): Payer: Self-pay | Admitting: Emergency Medicine

## 2022-07-24 ENCOUNTER — Other Ambulatory Visit: Payer: Self-pay

## 2022-07-24 DIAGNOSIS — L03116 Cellulitis of left lower limb: Secondary | ICD-10-CM | POA: Diagnosis not present

## 2022-07-24 DIAGNOSIS — M79662 Pain in left lower leg: Secondary | ICD-10-CM | POA: Diagnosis present

## 2022-07-24 DIAGNOSIS — Z91040 Latex allergy status: Secondary | ICD-10-CM | POA: Insufficient documentation

## 2022-07-24 MED ORDER — DOXYCYCLINE HYCLATE 100 MG PO CAPS: 100.0000 mg | ORAL_CAPSULE | Freq: Two times a day (BID) | ORAL | 0 refills | Status: DC

## 2022-07-24 MED ORDER — APIXABAN 2.5 MG PO TABS
10.0000 mg | ORAL_TABLET | Freq: Once | ORAL | Status: AC
  Administered 2022-07-24: 10 mg via ORAL
  Filled 2022-07-24: qty 4

## 2022-07-24 MED ORDER — DOXYCYCLINE HYCLATE 100 MG PO TABS
100.0000 mg | ORAL_TABLET | Freq: Once | ORAL | Status: AC
  Administered 2022-07-24: 100 mg via ORAL
  Filled 2022-07-24: qty 1

## 2022-07-24 NOTE — ED Provider Notes (Signed)
[facility] EMERGENCY DEPARTMENT Provider Note   CSN: [id number] Arrival date & time: 07/24/22  2130     History  Chief Complaint  Patient presents with   Leg Pain    Brandy Lewis is a 61 y.o. female.  With a history of recurrent cellulitis of the left lower extremity who presents the ED for evaluation of redness and aching pain to the left lower extremity.  She states she was bit by a spider 20 years ago and has had recurrent episodes of cellulitis since then which typically present similar to what she is experiencing today.  She has been treated with Keflex successfully in the past.  Most recent episode of cellulitis was 10 years ago.  Patient states she was taking a nap today when she woke up and noticed that her left leg had an aching pain.  She took a blanket off and noticed redness as well.  Has had fevers of 100.1 Fahrenheit at home for the past 3 days, was reportedly tested for flu and COVID and was negative.  Aching pain is mostly in the proximal hamstring region.  No history of blood clots, recent immobilization or surgeries, extended travel, shortness of breath, chest pain, cough.   Leg Pain      Home Medications Prior to Admission medications   Medication Sig Start Date End Date Taking? Authorizing Provider  BIOTIN PO Take 1 tablet by mouth daily.    [provider]  Cyanocobalamin (B-12 PO) Take 1 tablet by mouth daily.    [provider]  esomeprazole (NEXIUM) 40 MG capsule Take 1 capsule (40 mg total) by mouth daily before breakfast. 30 minutes before breafast 03/09/22   Mauri Pole, MD  famotidine (PEPCID) 40 MG tablet Take 1 tablet (40 mg total) by mouth at bedtime. 03/09/22   Mauri Pole, MD  fexofenadine (ALLEGRA ALLERGY) 180 MG tablet Take 180 mg by mouth daily.     [provider]  FOLIC ACID PO Take 1 tablet by mouth daily.    [provider]  hydrocortisone (ANUSOL-HC) 25 MG suppository Place 1  suppository (25 mg total) rectally at bedtime as needed for hemorrhoids or anal itching. 11/10/21   Mauri Pole, MD  Multiple Vitamins-Minerals (ADULT GUMMY) CHEW Chew 4 tablets by mouth daily.    [provider]  OVER THE COUNTER MEDICATION Take 1 capsule by mouth daily. diurex otc supplement    [provider]      Allergies    Molnupiravir, Diflucan [fluconazole], Latex, and Tape    Review of Systems   Review of Systems  Musculoskeletal:  Positive for joint swelling.  All other systems reviewed and are negative.   Physical Exam Updated Vital Signs BP (!) 147/75   Pulse 78   Temp 98.1 F (36.7 C) (Oral)   Resp 18   Ht '5\' 2"'$  (1.575 m)   Wt 116.1 kg   SpO2 96%   BMI 46.82 kg/m  Physical Exam Vitals and nursing note reviewed.  Constitutional:      General: She is not in acute distress.    Appearance: Normal appearance. She is normal weight. She is not ill-appearing.  HENT:     Head: Normocephalic and atraumatic.  Pulmonary:     Effort: Pulmonary effort is normal. No respiratory distress.  Abdominal:     General: Abdomen is flat.  Musculoskeletal:        General: Normal range of motion.     Cervical back:  Neck supple.     Comments: Bilateral lower extremities large, unable to appreciate any swelling.  There is erythema on the anterior portion of the left tibia extending approximately to just below patella.  There is also mild tenderness to palpation.  DP pulses 2+ bilaterally.  Sensation intact.  Skin:    General: Skin is warm and dry.     Capillary Refill: Capillary refill takes less than 2 seconds.  Neurological:     Mental Status: She is alert and oriented to person, place, and time.  Psychiatric:        Mood and Affect: Mood normal.        Behavior: Behavior normal.     ED Results / Procedures / Treatments   Labs (all labs ordered are listed, but only abnormal results are displayed) Labs Reviewed - No data to  display  EKG None  Radiology No results found.  Procedures Procedures    Medications Ordered in ED Medications - No data to display  ED Course/ Medical Decision Making/ A&P                           Medical Decision Making Risk Prescription drug management.  This patient presents to the ED for concern of left lower extremity erythema and tenderness, this involves an extensive number of treatment options, and is a complaint that carries with it a high risk of complications and morbidity.  The differential diagnosis includes cellulitis, DVT   Co morbidities that complicate the patient evaluation  Recurrent cellulitis of the left lower extremity  My initial workup includes ultrasound left lower extremity  Additional history obtained from: Nursing notes from this visit.  I ordered imaging studies including ultrasound DVT study left lower extremity.  This was ordered as an outpatient as we do not have ultrasound here tonight.  She will return tomorrow for this test.  Afebrile, hemodynamically stable.  Patient is a 61 year old female with a history of cellulitis of the left lower extremity presenting to the ED for evaluation of erythema and aching pain to the left lower extremity.  She states this is consistent with cellulitis that she has had in the past.  I believe this is the most likely diagnosis, however unable to rule out DVT at this time.  Patient was given a one-time dose of Eliquis and doxycycline while in the ED.  She was sent a prescription for doxycycline to cover cellulitis.  She will return tomorrow for her DVT study.  Provider will be informed if DVT is positive and she needs to go on long-term anticoagulation.  Informed patient to follow-up with her primary care provider to ensure resolution of cellulitis that this is the diagnosis.  Stable at discharge.  At this time there does not appear to be any evidence of an acute emergency medical condition and the patient  appears stable for discharge with appropriate outpatient follow up. Diagnosis was discussed with patient who verbalizes understanding of care plan and is agreeable to discharge. I have discussed return precautions with patient and husband who verbalizes understanding. Patient encouraged to follow-up with their PCP within 1 week. All questions answered.  Note: Portions of this report may have been transcribed using voice recognition software. Every effort was made to ensure accuracy; however, inadvertent computerized transcription errors may still be present.          Final Clinical Impression(s) / ED Diagnoses Final diagnoses:  None    Rx /  DC Orders ED Discharge Orders     None         Nehemiah Massed 07/24/22 [place], [place], DO 07/25/22 1738

## 2022-07-24 NOTE — Discharge Instructions (Addendum)
You have been seen today for your complaint of left leg redness and pain. Your imaging of an ultrasound of the left leg has been ordered.  They will contact you tomorrow regarding this. Your discharge medications include doxycycline.  This is an antibiotic.  This may cause an upset stomach.  This is normal.  You should take it with food.  You should take it as prescribed and for the entire duration of the prescription if the ultrasound is negative for blood clot. Please seek immediate medical care if you develop any of the following symptoms: Your symptoms get worse. You feel very sleepy. You develop vomiting or diarrhea that persists. You notice red streaks coming from the infected area. Your red area gets larger or turns dark in color. At this time there does not appear to be the presence of an emergent medical condition, however there is always the potential for conditions to change. Please read and follow the below instructions.  Do not take your medicine if  develop an itchy rash, swelling in your mouth or lips, or difficulty breathing; call 911 and seek immediate emergency medical attention if this occurs.  You may review your lab tests and imaging results in their entirety on your MyChart account.  Please discuss all results of fully with your primary care provider and other specialist at your follow-up visit.  Note: Portions of this text may have been transcribed using voice recognition software. Every effort was made to ensure accuracy; however, inadvertent computerized transcription errors may still be present.

## 2022-07-24 NOTE — ED Triage Notes (Signed)
Pt c/o LLE pain with redness and swelling noted; hx of cellulitis and sts it feels similar

## 2022-07-24 NOTE — ED Notes (Signed)
Radiology called to schedule outpatient DVT study with [place], appointment tomorrow 10/29 at 1000

## 2022-07-25 ENCOUNTER — Emergency Department (HOSPITAL_BASED_OUTPATIENT_CLINIC_OR_DEPARTMENT_OTHER)
Admit: 2022-07-25 | Discharge: 2022-07-25 | Disposition: A | Discharge status: Home / Self Care | Payer: BC Managed Care – PPO | Attending: Emergency Medicine | Admitting: Emergency Medicine

## 2022-09-07 ENCOUNTER — Telehealth: Payer: Self-pay | Admitting: Gastroenterology

## 2022-09-07 NOTE — Telephone Encounter (Signed)
Spoke with the patient. She has also been referred to Hematology for management of the IDA. She was told to schedule here for the [place] work up. Patient is seen by Eda Paschal in [place] at [facility]. Unable to access records in [facility] or Care Everywhere. Called the PCP. Left a message for medical records requesting the labs to be faxed to [place] at [phone].

## 2022-09-07 NOTE — Telephone Encounter (Signed)
Patient recently had a physical and iron was at 16 and doctor advised that she needs an infusion. She needs more advice. Please advise.

## 2022-09-21 ENCOUNTER — Inpatient Hospital Stay: Payer: BC Managed Care – PPO | Attending: Internal Medicine | Admitting: Internal Medicine

## 2022-09-21 ENCOUNTER — Encounter: Payer: Self-pay | Admitting: Internal Medicine

## 2022-09-21 ENCOUNTER — Inpatient Hospital Stay: Payer: BC Managed Care – PPO

## 2022-09-21 VITALS — BP 154/88 | HR 65 | Temp 98.6°F | Resp 17 | Wt 249.8 lb

## 2022-09-21 DIAGNOSIS — D509 Iron deficiency anemia, unspecified: Secondary | ICD-10-CM | POA: Diagnosis not present

## 2022-09-21 DIAGNOSIS — Z9884 Bariatric surgery status: Secondary | ICD-10-CM | POA: Diagnosis not present

## 2022-09-21 DIAGNOSIS — K649 Unspecified hemorrhoids: Secondary | ICD-10-CM | POA: Diagnosis not present

## 2022-09-21 NOTE — Addendum Note (Signed)
Addended byJane Canary on: 09/21/2022 12:01 PM   Modules accepted: Orders

## 2022-09-21 NOTE — Progress Notes (Signed)
[facility]  [phone] [phone]  ID: [id number] Novel OB: October 25, 1960  MR#: [id number]  ION#:[id number]  Patient Care Team: Enid Skeens., MD as PCP - General (Family Medicine)  REFERRING PROVIDER: Dr. Cecille Amsterdam  REASON FOR REFERRAL: IDA  HPI: Brandy Lewis is a 61 y.o. female with past medical history of obesity, prediabetes and gastric bypass surgery was referred to hematology for management of iron deficiency anemia.  Patient has a history of hemorrhoids.  She started [facility] for weight loss in May 2023.  Since has noticed more constipation and mild bleeding from hemorrhoids. Patient had labs done on 09/05/2022.  Iron 16, saturation 4%.  Ferritin not done.  WBC 4.8, hemoglobin 13.1, MCV 82, platelet 267.  B12 830.  Folate>24.  Last colonoscopy done on 08/11/2018 at [facility] by Dr. Harl Bowie showed 1 polyp in ascending colon, diverticuli and nonbleeding internal hemorrhoids.  She started iron pills but could not tolerate due to worsening constipation.  Patient denies fever, chills, nausea, vomiting, shortness of breath, cough, abdominal pain, bleeding, bowel or bladder issues. Energy level is good.  Appetite is good.  Denies any weight loss. Denies pain.   REVIEW OF SYSTEMS:   ROS  As per HPI. Otherwise, a complete review of systems is negative.  PAST MEDICAL HISTORY: Past Medical History:  Diagnosis Date   Allergy    Fatty liver    Gallstones     PAST SURGICAL HISTORY: Past Surgical History:  Procedure Laterality Date   BARIATRIC SURGERY     BIOPSY  08/11/2018   Procedure: BIOPSY;  Surgeon: Mauri Pole, MD;  Location: [facility];  Service: Endoscopy;;   CHOLECYSTECTOMY     COLONOSCOPY WITH PROPOFOL [name] 08/11/2018   Procedure: COLONOSCOPY WITH PROPOFOL;  Surgeon: Mauri Pole, MD;  Location: [facility];  Service: Endoscopy;  Laterality: [name];   DILATION AND CURETTAGE OF UTERUS     ENDOMETRIAL  ABLATION     GALLBLADDER SURGERY     GASTRIC BYPASS     POLYPECTOMY  08/11/2018   Procedure: POLYPECTOMY;  Surgeon: Mauri Pole, MD;  Location: [facility];  Service: Endoscopy;;    FAMILY HISTORY: Family History  Problem Relation Age of Onset   Lung cancer Mother    Diabetes Father    Heart disease Father     HEALTH MAINTENANCE: Social History   Tobacco Use   Smoking status: Never    Passive exposure: Yes   Smokeless tobacco: Never   Tobacco comments:    Significant second hand smoke exposure as a child  Vaping Use   Vaping Use: Never used  Substance Use Topics   Alcohol use: No   Drug use: No     Allergies  Allergen Reactions   Molnupiravir Hives   Diflucan [Fluconazole]     Causes yeast infections    Latex Rash   Tape Rash    Current Outpatient Medications  Medication Sig Dispense Refill   BIOTIN PO Take 1 tablet by mouth daily.     Cyanocobalamin (B-12 PO) Take 1 tablet by mouth daily.     doxycycline (VIBRAMYCIN) 100 MG capsule Take 1 capsule (100 mg total) by mouth 2 (two) times daily. 20 capsule 0   esomeprazole (NEXIUM) 40 MG capsule Take 1 capsule (40 mg total) by mouth daily before breakfast. 30 minutes before breafast 90 capsule 1   famotidine (PEPCID) 40 MG tablet Take 1 tablet (40 mg total) by mouth at bedtime. 30 tablet 3  fexofenadine (ALLEGRA ALLERGY) 180 MG tablet Take 180 mg by mouth daily.      FOLIC ACID PO Take 1 tablet by mouth daily.     hydrocortisone (ANUSOL-HC) 25 MG suppository Place 1 suppository (25 mg total) rectally at bedtime as needed for hemorrhoids or anal itching. 12 suppository 0   Multiple Vitamins-Minerals (ADULT GUMMY) CHEW Chew 4 tablets by mouth daily.     OVER THE COUNTER MEDICATION Take 1 capsule by mouth daily. diurex otc supplement     No current facility-administered medications for this visit.    OBJECTIVE: There were no vitals filed for this visit.   There is no height or weight on file to calculate  BMI.      General: Well-developed, well-nourished, no acute distress. Eyes: Pink conjunctiva, anicteric sclera. HEENT: Normocephalic, moist mucous membranes, clear oropharnyx. Lungs: Clear to auscultation bilaterally. Heart: Regular rate and rhythm. No rubs, murmurs, or gallops. Abdomen: Soft, nontender, nondistended. No organomegaly noted, normoactive bowel sounds. Musculoskeletal: No edema, cyanosis, or clubbing. Neuro: Alert, answering all questions appropriately. Cranial nerves grossly intact. Skin: No rashes or petechiae noted. Psych: Normal affect. Lymphatics: No cervical, calvicular, axillary or inguinal LAD.   LAB RESULTS:  Lab Results  Component Value Date   NA 141 12/06/2019   K 4.2 12/06/2019   CL 110 12/06/2019   CO2 22 12/06/2019   GLUCOSE 114 (H) 12/06/2019   BUN 21 (H) 12/06/2019   CREATININE 0.87 12/06/2019   CALCIUM 9.1 12/06/2019   GFRNONAA >60 12/06/2019   GFRAA >60 12/06/2019    Lab Results  Component Value Date   WBC 7.7 12/06/2019   HGB 14.1 12/06/2019   HCT 43.8 12/06/2019   MCV 84.4 12/06/2019   PLT 303 12/06/2019    No results found for: "TIBC", "FERRITIN", "IRONPCTSAT"   STUDIES: No results found.  ASSESSMENT AND PLAN:   Brandy Lewis is a 60 y.o. female with pmh of obesity, prediabetes was referred to hematology for management of iron deficiency anemia.  # Iron deficiency anemia #Gastric bypass surgery  #Hemorrhoidal bleed -Progressive, could not tolerate oral iron due to constipation - could be from chronic mild hemorrhoidal bleed and Gastric bypass  - Patient had labs done on 09/05/2022.  Iron 16, saturation 4%.  Ferritin not done.  WBC 4.8, hemoglobin 13.1, MCV 82, platelet 267.  B12 830.  Folate>24.    -I discussed about IV Feraheme weekly x 2.  Side effects such as occasional nausea, back pain was discussed.  Low but potential risk of anaphylactic reaction was discussed.  - Last colonoscopy done on 08/11/2018 at [facility] by  Dr. Harl Bowie showed 1 polyp in ascending colon, diverticuli and nonbleeding internal hemorrhoids.  Her iron deficiency could be from chronic mild hemorrhoidal bleed.  Patient has follow-up appointment with GI in February 2024.  IV ferraheme weekly x 2  RTC in 10 weeks for md visit, labs prior   Patient expressed understanding and was in agreement with this plan. She also understands that She can call clinic at any time with any questions, concerns, or complaints.   I spent a total of 45 minutes reviewing chart data, face-to-face evaluation with the patient, counseling and coordination of care as detailed above.  Jane Canary, MD   09/21/2022 10:42 AM

## 2022-10-04 ENCOUNTER — Inpatient Hospital Stay: Payer: BC Managed Care – PPO | Attending: Internal Medicine

## 2022-10-04 VITALS — BP 123/82 | HR 66 | Temp 97.2°F | Resp 18

## 2022-10-04 DIAGNOSIS — D500 Iron deficiency anemia secondary to blood loss (chronic): Secondary | ICD-10-CM

## 2022-10-04 DIAGNOSIS — D509 Iron deficiency anemia, unspecified: Secondary | ICD-10-CM | POA: Insufficient documentation

## 2022-10-04 MED ORDER — SODIUM CHLORIDE 0.9 % IV SOLN
Freq: Once | INTRAVENOUS | Status: AC
  Filled 2022-10-04: qty 250

## 2022-10-04 MED ORDER — SODIUM CHLORIDE 0.9 % IV SOLN
510.0000 mg | Freq: Once | INTRAVENOUS | Status: AC
  Administered 2022-10-04: 510 mg via INTRAVENOUS
  Filled 2022-10-04: qty 510

## 2022-10-04 NOTE — Patient Instructions (Signed)

## 2022-10-11 ENCOUNTER — Inpatient Hospital Stay: Payer: BC Managed Care – PPO

## 2022-10-11 VITALS — BP 136/73 | HR 66 | Temp 97.1°F | Resp 18

## 2022-10-11 DIAGNOSIS — D500 Iron deficiency anemia secondary to blood loss (chronic): Secondary | ICD-10-CM

## 2022-10-11 DIAGNOSIS — D509 Iron deficiency anemia, unspecified: Secondary | ICD-10-CM | POA: Diagnosis not present

## 2022-10-11 MED ORDER — SODIUM CHLORIDE 0.9 % IV SOLN
Freq: Once | INTRAVENOUS | Status: AC
  Filled 2022-10-11: qty 250

## 2022-10-11 MED ORDER — SODIUM CHLORIDE 0.9 % IV SOLN
510.0000 mg | Freq: Once | INTRAVENOUS | Status: AC
  Administered 2022-10-11: 510 mg via INTRAVENOUS
  Filled 2022-10-11: qty 510

## 2022-10-19 ENCOUNTER — Other Ambulatory Visit: Payer: Self-pay | Admitting: Gastroenterology

## 2022-11-02 ENCOUNTER — Ambulatory Visit (INDEPENDENT_AMBULATORY_CARE_PROVIDER_SITE_OTHER): Payer: BC Managed Care – PPO | Admitting: Gastroenterology

## 2022-11-02 ENCOUNTER — Encounter: Payer: Self-pay | Admitting: Gastroenterology

## 2022-11-02 VITALS — BP 124/68 | HR 66 | Ht 62.0 in | Wt 244.0 lb

## 2022-11-02 DIAGNOSIS — K581 Irritable bowel syndrome with constipation: Secondary | ICD-10-CM | POA: Diagnosis not present

## 2022-11-02 DIAGNOSIS — K5904 Chronic idiopathic constipation: Secondary | ICD-10-CM

## 2022-11-02 DIAGNOSIS — D509 Iron deficiency anemia, unspecified: Secondary | ICD-10-CM | POA: Diagnosis not present

## 2022-11-02 MED ORDER — LINACLOTIDE 145 MCG PO CAPS: 145.0000 ug | ORAL_CAPSULE | Freq: Every day | ORAL | 11 refills | Status: DC

## 2022-11-02 MED ORDER — NA SULFATE-K SULFATE-MG SULF 17.5-3.13-1.6 GM/177ML PO SOLN: 1.0000 | Freq: Once | ORAL | 0 refills | Status: AC

## 2022-11-02 NOTE — Patient Instructions (Signed)
You have been scheduled for an endoscopy and colonoscopy. Please follow the written instructions given to you at your visit today. Please pick up your prep supplies at the pharmacy within the next 1-3 days. If you use inhalers (even only as needed), please bring them with you on the day of your procedure.  Start over the counter Benefiber one tablespoon three times a day with meals.   We have sent the following medications to your pharmacy for you to pick up at your convenience: [place] [facility].  Linzess works best when taken once a day every day, on an empty stomach, at least 30 minutes before your first meal of the day.  When Linzess is taken daily as directed:  *Constipation relief is typically felt in about a week *IBS-C patients may begin to experience relief from belly pain and overall abdominal symptoms (pain, discomfort, and bloating) in about 1 week,   with symptoms typically improving over 12 weeks.  Diarrhea may occur in the first 2 weeks -keep taking it.  The diarrhea should go away and you should start having normal, complete, full bowel movements. It may be helpful to start treatment when you can be near the comfort of your own bathroom, such as a weekend.   The [facility] providers would like to encourage you to use [facility] to communicate with providers for non-urgent requests or questions.  Due to long hold times on the telephone, sending your provider a message by [facility] may be a faster and more efficient way to get a response.  Please allow 48 business hours for a response.  Please remember that this is for non-urgent requests.   Due to recent changes in healthcare laws, you may see the results of your imaging and laboratory studies on MyChart before your provider has had a chance to review them.  We understand that in some cases there may be results that are confusing or concerning to you. Not all laboratory results come back in the same time frame and the provider may be  waiting for multiple results in order to interpret others.  Please give [place] 48 hours in order for your provider to thoroughly review all the results before contacting the office for clarification of your results.

## 2022-11-02 NOTE — Progress Notes (Signed)
Brandy Lewis    [id number]    [birth year]  Primary Care Physician:Slatosky, Marshall Cork., MD  Referring Physician: Enid Skeens., MD [address],  [place] [address]   Chief complaint:  Low iron  HPI:  62 year old very pleasant female here for follow-up for iron deficiency.  She was last seen in office in February 2023 for increased heartburn, hoarseness of voice and intermittent dysphagia.  She is s/p gastric bypass surgery and cholecystectomy  She had barium esophagram that was negative for any mass lesion or esophageal strictures   She has intermittent rectal bleeding from hemorrhoids otherwise denies any melena.  She is experiencing worsening constipation since she got started on Mounjaro for weight loss.  She has lost significant weight on Mounjaro and plans to get her weight below 200 pounds.   Barium esophagram July 23, 2021: Nonspecific esophageal motility disorder otherwise unremarkable exam.  30 mm barium tablet passes readily into the stomach   Ultrasound thyroid April 23, 2021 Parenchymal Echotexture: Mildly heterogeneous   Isthmus: 0.3 cm   Right lobe: 4.1 x 1.2 x 1.6 cm   Left lobe: 4.2 x 1.3 x 1.5 cm'   CT chest without contrast March 09, 2021: 1. Stable small bilateral lower lobe pulmonary nodules. No new pulmonary nodules or worrisome pulmonary lesions. Recommend follow-up noncontrast chest CT in 12 months to confer 2 years of stability. 2. No mediastinal or hilar mass or adenopathy. 3. Stable coronary artery calcifications. 4. Surgical changes from gastric bypass surgery. No complicating features. 5. Aortic atherosclerosis.   Colonoscopy 08/11/2018 - One 2 mm polyp in the ascending colon, removed with a cold biopsy forceps. Resected and retrieved. - Anal papilla(e) were hypertrophied. Biopsied. - Diverticulosis in the sigmoid colon. - Non-bleeding internal hemorrhoids.     Outpatient Encounter Medications as of 11/02/2022   Medication Sig   BIOTIN PO Take 1 tablet by mouth daily.   Black Cohosh 20 MG TABS Take 2 tablets by mouth daily. 3000 mg   Cyanocobalamin (B-12 PO) Take 1 tablet by mouth daily.   esomeprazole (NEXIUM) 40 MG capsule Take 1 capsule (40 mg total) by mouth daily before breakfast. 30 minutes before breafast   famotidine (PEPCID) 40 MG tablet TAKE 1 TABLET BY MOUTH AT BEDTIME   fexofenadine (ALLEGRA ALLERGY) 180 MG tablet Take 180 mg by mouth daily.   FOLIC ACID PO Take 1 tablet by mouth daily.   MOUNJARO 15 MG/0.5ML Pen SMARTSIG:1 Pre-Filled Pen Syringe SUB-Q Once a Week   Multiple Vitamins-Minerals (ADULT GUMMY) CHEW Chew 4 tablets by mouth daily.   cetirizine (ZYRTEC) 5 MG chewable tablet Chew 5 mg by mouth daily. (Patient not taking: Reported on 11/02/2022)   doxycycline (VIBRAMYCIN) 100 MG capsule Take 1 capsule (100 mg total) by mouth 2 (two) times daily.   hydrocortisone (ANUSOL-HC) 25 MG suppository Place 1 suppository (25 mg total) rectally at bedtime as needed for hemorrhoids or anal itching. (Patient not taking: Reported on 11/02/2022)   OVER THE COUNTER MEDICATION Take 1 capsule by mouth daily. diurex otc supplement   No facility-administered encounter medications on file as of 11/02/2022.    Allergies as of 11/02/2022 - Review Complete 11/02/2022  Allergen Reaction Noted   Molnupiravir Hives 03/26/2021   Diflucan [fluconazole]  09/28/2017   Latex Rash 09/28/2017   Tape Rash 07/18/2018    Past Medical History:  Diagnosis Date   Allergy    Fatty liver  Gallstones     Past Surgical History:  Procedure Laterality Date   BARIATRIC SURGERY     BIOPSY  08/11/2018   Procedure: BIOPSY;  Surgeon: Mauri Pole, MD;  Location: [facility];  Service: Endoscopy;;   CHOLECYSTECTOMY     COLONOSCOPY WITH PROPOFOL [name] 08/11/2018   Procedure: COLONOSCOPY WITH PROPOFOL;  Surgeon: Mauri Pole, MD;  Location: [facility];  Service: Endoscopy;  Laterality: [name];   DILATION  AND CURETTAGE OF UTERUS     ENDOMETRIAL ABLATION     GALLBLADDER SURGERY     GASTRIC BYPASS     POLYPECTOMY  08/11/2018   Procedure: POLYPECTOMY;  Surgeon: Mauri Pole, MD;  Location: [facility];  Service: Endoscopy;;    Family History  Problem Relation Age of Onset   Lung cancer Mother    Diabetes Father    Heart disease Father     Social History   Socioeconomic History   Marital status: Married    Spouse name: Elta Guadeloupe   Number of children: 1   Years of education: Not on file   Highest education level: Not on file  Occupational History   Not on file  Tobacco Use   Smoking status: Never    Passive exposure: Yes   Smokeless tobacco: Never   Tobacco comments:    Significant second hand smoke exposure as a child  Vaping Use   Vaping Use: Never used  Substance and Sexual Activity   Alcohol use: No   Drug use: No   Sexual activity: Yes    Birth control/protection: None  Other Topics Concern   Not on file  Social History Narrative   Not on file   Social Determinants of Health   Financial Resource Strain: Not on file  Food Insecurity: Not on file  Transportation Needs: Not on file  Physical Activity: Not on file  Stress: Not on file  Social Connections: Not on file  Intimate Partner Violence: Not on file      Review of systems: All other review of systems negative except as mentioned in the HPI.   Physical Exam: Vitals:   11/02/22 0825  BP: 124/68  Pulse: 66   Body mass index is 44.63 kg/m. Gen:      No acute distress HEENT:  sclera anicteric Abd:      soft, non-tender; no palpable masses, no distension Ext:    No edema Neuro: alert and oriented x 3 Psych: normal mood and affect  Data Reviewed:  Reviewed labs, radiology imaging, old records and pertinent past GI work up   Assessment and Plan/Recommendations:  62 year old very pleasant female s/p gastric bypass surgery, morbid obesity BMI > 40 here for evaluation of iron deficiency  anemia  Will schedule for EGD and colonoscopy for further evaluation iron deficiency The risks and benefits as well as alternatives of endoscopic procedure(s) have been discussed and reviewed. All questions answered. The patient agrees to proceed.  IBS constipation: Start Linzess 290 mcg daily Increase dietary fiber and water intake Start Benefiber 1 tablespoon 3 times daily with meals  Return in 2 to 3 months  The patient was provided an opportunity to ask questions and all were answered. The patient agreed with the plan and demonstrated an understanding of the instructions.  Damaris Hippo , MD    CC: Enid Skeens., MD

## 2022-11-11 ENCOUNTER — Encounter: Payer: Self-pay | Admitting: Internal Medicine

## 2022-11-11 ENCOUNTER — Other Ambulatory Visit (HOSPITAL_COMMUNITY): Payer: Self-pay

## 2022-11-11 ENCOUNTER — Telehealth: Payer: Self-pay | Admitting: Pharmacy Technician

## 2022-11-11 NOTE — Telephone Encounter (Signed)
Pharmacy Patient Advocate Encounter  Received notification from [facility] that the request for prior authorization for Brandy Lewis has been denied due to     Please be advised we currently do not have a Pharmacist to review denials, therefore you will need to process appeals accordingly as needed. Thanks for your support at this time.   You may call [phone] or fax [phone], to appeal.

## 2022-11-11 NOTE — Telephone Encounter (Signed)
Patient Advocate Encounter  Received notification from [facility] that prior authorization for [facility] 145MC[facility] is required.   PA submitted on 2.15.24 Key [id number] Status is pending

## 2022-11-12 NOTE — Telephone Encounter (Signed)
Dr Silverio Decamp it seems patients insurance will not cover Linzess or Trulance

## 2022-11-16 ENCOUNTER — Telehealth: Payer: Self-pay | Admitting: Gastroenterology

## 2022-11-16 NOTE — Telephone Encounter (Signed)
Inbound call from patient stating that her insurance has denied Linzess. Patient is requesting a call back to discuss what she needs to do moving forward because her constipation is back. Please advise.

## 2022-11-17 NOTE — Telephone Encounter (Signed)
11/11/22 from prior authorization team

## 2022-11-17 NOTE — Telephone Encounter (Signed)
Dr Silverio Decamp, Patients insurance has denied her Linzess and Trulance. What other medication do you want to try for her? Or I can send in something else to see if its approved or something just over the counter?

## 2022-11-17 NOTE — Telephone Encounter (Signed)
Please see previous phone note.  

## 2022-11-22 ENCOUNTER — Other Ambulatory Visit: Payer: Self-pay | Admitting: Gastroenterology

## 2022-11-22 NOTE — Telephone Encounter (Signed)
Patient called to follow up on medication. I advised patient we were waiting for a response from Dr. Silverio Decamp to see what her recommendations were. Patient stated she has waited a week and has not heard anything. Please advise.

## 2022-11-23 NOTE — Telephone Encounter (Signed)
Dr Nandigam Please advise  

## 2022-11-24 MED ORDER — LUBIPROSTONE 24 MCG PO CAPS: 24.0000 ug | ORAL_CAPSULE | Freq: Two times a day (BID) | ORAL | 3 refills | Status: AC

## 2022-11-24 NOTE — Telephone Encounter (Signed)
Please check with Amitiza 24 mcg twice daily is covered, if yes please send prescription for 30 days.  Thank you

## 2022-11-24 NOTE — Telephone Encounter (Signed)
Called patient to inform that we can try Amitiza 24 mcg, she is willing to see if her insurance will cover that I sent it in today to her pharmacy

## 2022-12-06 ENCOUNTER — Inpatient Hospital Stay: Payer: BC Managed Care – PPO | Attending: Internal Medicine

## 2022-12-06 DIAGNOSIS — Z801 Family history of malignant neoplasm of trachea, bronchus and lung: Secondary | ICD-10-CM | POA: Diagnosis not present

## 2022-12-06 DIAGNOSIS — K5900 Constipation, unspecified: Secondary | ICD-10-CM | POA: Insufficient documentation

## 2022-12-06 DIAGNOSIS — K760 Fatty (change of) liver, not elsewhere classified: Secondary | ICD-10-CM | POA: Insufficient documentation

## 2022-12-06 DIAGNOSIS — Z9884 Bariatric surgery status: Secondary | ICD-10-CM | POA: Diagnosis not present

## 2022-12-06 DIAGNOSIS — E669 Obesity, unspecified: Secondary | ICD-10-CM | POA: Insufficient documentation

## 2022-12-06 DIAGNOSIS — Z79899 Other long term (current) drug therapy: Secondary | ICD-10-CM | POA: Diagnosis not present

## 2022-12-06 DIAGNOSIS — D509 Iron deficiency anemia, unspecified: Secondary | ICD-10-CM | POA: Diagnosis present

## 2022-12-06 LAB — IRON AND TIBC
Iron: 113 ug/dL (ref 28–170)
Saturation Ratios: 33 % — ABNORMAL HIGH (ref 10.4–31.8)
TIBC: 346 ug/dL (ref 250–450)
UIBC: 233 ug/dL

## 2022-12-06 LAB — CBC WITH DIFFERENTIAL/PLATELET
Abs Immature Granulocytes: 0.01 10*3/uL (ref 0.00–0.07)
Basophils Absolute: 0.1 10*3/uL (ref 0.0–0.1)
Basophils Relative: 1 %
Eosinophils Absolute: 0.2 10*3/uL (ref 0.0–0.5)
Eosinophils Relative: 4 %
HCT: 42.5 % (ref 36.0–46.0)
Hemoglobin: 14 g/dL (ref 12.0–15.0)
Immature Granulocytes: 0 %
Lymphocytes Relative: 38 %
Lymphs Abs: 2.1 10*3/uL (ref 0.7–4.0)
MCH: 28.1 pg (ref 26.0–34.0)
MCHC: 32.9 g/dL (ref 30.0–36.0)
MCV: 85.3 fL (ref 80.0–100.0)
Monocytes Absolute: 0.5 10*3/uL (ref 0.1–1.0)
Monocytes Relative: 10 %
Neutro Abs: 2.6 10*3/uL (ref 1.7–7.7)
Neutrophils Relative %: 47 %
Platelets: 270 10*3/uL (ref 150–400)
RBC: 4.98 MIL/uL (ref 3.87–5.11)
RDW: 14.2 % (ref 11.5–15.5)
WBC: 5.5 10*3/uL (ref 4.0–10.5)
nRBC: 0 % (ref 0.0–0.2)

## 2022-12-06 LAB — FERRITIN: Ferritin: 101 ng/mL (ref 11–307)

## 2022-12-07 ENCOUNTER — Telehealth: Payer: Self-pay | Admitting: Gastroenterology

## 2022-12-07 ENCOUNTER — Other Ambulatory Visit: Payer: BC Managed Care – PPO

## 2022-12-07 NOTE — Telephone Encounter (Signed)
Inbound call from patient stating that she is needing a PA for Trulance. Patient stated that she has been trying to get this medication approved. Patient is requesting a call back to discuss. Please advise.

## 2022-12-07 NOTE — Telephone Encounter (Signed)
Have you seen a prior auth for Trulance ?

## 2022-12-09 ENCOUNTER — Other Ambulatory Visit (HOSPITAL_COMMUNITY): Payer: Self-pay

## 2022-12-09 ENCOUNTER — Encounter: Payer: Self-pay | Admitting: Internal Medicine

## 2022-12-09 ENCOUNTER — Ambulatory Visit: Payer: BC Managed Care – PPO | Admitting: Internal Medicine

## 2022-12-09 ENCOUNTER — Telehealth: Payer: Self-pay

## 2022-12-09 ENCOUNTER — Inpatient Hospital Stay (HOSPITAL_BASED_OUTPATIENT_CLINIC_OR_DEPARTMENT_OTHER): Payer: BC Managed Care – PPO | Admitting: Internal Medicine

## 2022-12-09 VITALS — BP 135/81 | HR 74 | Temp 98.4°F | Resp 18 | Ht 62.0 in | Wt 239.6 lb

## 2022-12-09 DIAGNOSIS — D509 Iron deficiency anemia, unspecified: Secondary | ICD-10-CM

## 2022-12-09 DIAGNOSIS — R946 Abnormal results of thyroid function studies: Secondary | ICD-10-CM | POA: Diagnosis not present

## 2022-12-09 DIAGNOSIS — Z9884 Bariatric surgery status: Secondary | ICD-10-CM | POA: Diagnosis not present

## 2022-12-09 NOTE — Progress Notes (Signed)
[place]  [phone] [phone]  ID: Brandy Lewis OB: [birth year]  MR#: [id number]  [id number]  Patient Care Team: Enid Skeens., MD as PCP - General (Family Medicine)  REFERRING PROVIDER: Dr. Cecille Amsterdam  REASON FOR REFERRAL: IDA  HPI: Brandy Lewis is a 62 y.o. female with past medical history of obesity, prediabetes and gastric bypass surgery was referred to hematology for management of iron deficiency anemia.  Patient has a history of hemorrhoids.  She started [facility] for weight loss in May 2023.  Since has noticed more constipation and mild bleeding from hemorrhoids. Patient had labs done on 09/05/2022.  Iron 16, saturation 4%.  Ferritin not done.  WBC 4.8, hemoglobin 13.1, MCV 82, platelet 267.  B12 830.  Folate>24.  Last colonoscopy done on 08/11/2018 at [facility] by Dr. Harl Bowie showed 1 polyp in ascending colon, diverticuli and nonbleeding internal hemorrhoids.  She started iron pills but could not tolerate due to worsening constipation.  Patient denies fever, chills, nausea, vomiting, shortness of breath, cough, abdominal pain, bleeding, bowel or bladder issues. Energy level is good.  Appetite is good.  Denies any weight loss. Denies pain.  Interval history Patient was seen today to discuss labs. She completed 2 doses of IV Feraheme.  Symptomatically she does not feel much different.  Denies any concerns.  REVIEW OF SYSTEMS:   ROS  As per HPI. Otherwise, a complete review of systems is negative.  PAST MEDICAL HISTORY: Past Medical History:  Diagnosis Date   Allergy    Fatty liver    Gallstones     PAST SURGICAL HISTORY: Past Surgical History:  Procedure Laterality Date   BARIATRIC SURGERY     BIOPSY  08/11/2018   Procedure: BIOPSY;  Surgeon: Mauri Pole, MD;  [facility]: [facility];  Service: Endoscopy;;   CHOLECYSTECTOMY     COLONOSCOPY WITH PROPOFOL [name] 08/11/2018   Procedure: COLONOSCOPY  WITH PROPOFOL;  Surgeon: Mauri Pole, MD;  Location: [facility];  Service: Endoscopy;  Laterality: [name];   DILATION AND CURETTAGE OF UTERUS     ENDOMETRIAL ABLATION     GALLBLADDER SURGERY     GASTRIC BYPASS     POLYPECTOMY  08/11/2018   Procedure: POLYPECTOMY;  Surgeon: Mauri Pole, MD;  [facility];  Service: Endoscopy;;    FAMILY HISTORY: Family History  Problem Relation Age of Onset   Lung cancer Mother    Diabetes Father    Heart disease Father     HEALTH MAINTENANCE: Social History   Tobacco Use   Smoking status: Never    Passive exposure: Yes   Smokeless tobacco: Never   Tobacco comments:    Significant second hand smoke exposure as a child  Vaping Use   Vaping Use: Never used  Substance Use Topics   Alcohol use: No   Drug use: No     Allergies  Allergen Reactions   Molnupiravir Hives   Diflucan [Fluconazole]     Causes yeast infections    Latex Rash   Tape Rash    Current Outpatient Medications  Medication Sig Dispense Refill   BIOTIN PO Take 1 tablet by mouth daily.     Black Cohosh 20 MG TABS Take 2 tablets by mouth daily. 3000 mg     cetirizine (ZYRTEC) 5 MG chewable tablet Chew 5 mg by mouth daily.     Cyanocobalamin (B-12 PO) Take 1 tablet by mouth daily.     esomeprazole (NEXIUM) 40 MG  capsule Take 1 capsule (40 mg total) by mouth daily before breakfast. 30 minutes before breafast 90 capsule 1   famotidine (PEPCID) 40 MG tablet TAKE 1 TABLET BY MOUTH AT BEDTIME 30 tablet 0   fexofenadine (ALLEGRA ALLERGY) 180 MG tablet Take 180 mg by mouth daily.     FOLIC ACID PO Take 1 tablet by mouth daily.     MOUNJARO 15 MG/0.5ML Pen SMARTSIG:1 Pre-Filled Pen Syringe SUB-Q Once a Week     Multiple Vitamins-Minerals (ADULT GUMMY) CHEW Chew 4 tablets by mouth daily.     lubiprostone (AMITIZA) 24 MCG capsule Take 1 capsule (24 mcg total) by mouth 2 (two) times daily with a meal. (Patient not taking: Reported on 12/09/2022) 60 capsule 3    No current facility-administered medications for this visit.    OBJECTIVE: Vitals:   12/09/22 1521  BP: 135/81  Pulse: 74  Resp: 18  Temp: 98.4 F (36.9 C)  SpO2: 98%     Body mass index is 43.82 kg/m.      General: Well-developed, well-nourished, no acute distress. Eyes: Pink conjunctiva, anicteric sclera. HEENT: Normocephalic, moist mucous membranes, clear oropharnyx. Lungs: Clear to auscultation bilaterally. Heart: Regular rate and rhythm. No rubs, murmurs, or gallops. Abdomen: Soft, nontender, nondistended. No organomegaly noted, normoactive bowel sounds. Musculoskeletal: No edema, cyanosis, or clubbing. Neuro: Alert, answering all questions appropriately. Cranial nerves grossly intact. Skin: No rashes or petechiae noted. Psych: Normal affect. Lymphatics: No cervical, calvicular, axillary or inguinal LAD.   LAB RESULTS:  Lab Results  Component Value Date   NA 141 12/06/2019   K 4.2 12/06/2019   CL 110 12/06/2019   CO2 22 12/06/2019   GLUCOSE 114 (H) 12/06/2019   BUN 21 (H) 12/06/2019   CREATININE 0.87 12/06/2019   CALCIUM 9.1 12/06/2019   GFRNONAA >60 12/06/2019   GFRAA >60 12/06/2019    Lab Results  Component Value Date   WBC 5.5 12/06/2022   NEUTROABS 2.6 12/06/2022   HGB 14.0 12/06/2022   HCT 42.5 12/06/2022   MCV 85.3 12/06/2022   PLT 270 12/06/2022    Lab Results  Component Value Date   TIBC 346 12/06/2022   FERRITIN 101 12/06/2022   IRONPCTSAT 33 (H) 12/06/2022     STUDIES: No results found.  ASSESSMENT AND PLAN:   Brandy Lewis is a 62 y.o. female with pmh of obesity, prediabetes was referred to hematology for management of iron deficiency anemia.  # Iron deficiency anemia #Gastric bypass surgery  #Hemorrhoidal bleed -Likely malabsorption from gastric bypass surgery.  GI bleed cannot be ruled out.  She was evaluated by  Dr. Harl Bowie and is planned for colonoscopy and endoscopy soon.   -Labs from December 2023 showed  saturation 4%, ferritin was not available and normal hemoglobin.  Received IV Feraheme x 2 doses.  Iron panel and hemoglobin normal.  excellent response to iron infusion.  She does not need more iron at this time.  But I discussed about her needing iron infusions intermittently with her history of gastric bypass surgery.  -I will also obtain B12 level with her history of gastric bypass surgery  # Abnormal TFTs -Per patient, she was found to have abnormal TFT and would prefer repeat testing.  Will repeat thyroid function level with next blood work.   RTC in 5 months for MD visit, labs  Patient expressed understanding and was in agreement with this plan. She also understands that She can call clinic at any time with any questions, concerns,  or complaints.   I spent a total of 25 minutes reviewing chart data, face-to-face evaluation with the patient, counseling and coordination of care as detailed above.  Jane Canary, MD   12/09/2022 4:14 PM

## 2022-12-09 NOTE — Telephone Encounter (Signed)
PA has not come through yet, manually started one in [facility]; will be updated in additional encounter created.

## 2022-12-09 NOTE — Progress Notes (Signed)
No concerns for the provider. 

## 2022-12-09 NOTE — Telephone Encounter (Signed)
Ok. Thank You! °

## 2022-12-09 NOTE — Telephone Encounter (Signed)
PA request received via provider for Trulance '3MG'$  tablets  PA submitted via CMM to [facility] and is pending determination.  Key: [id number]

## 2022-12-13 ENCOUNTER — Other Ambulatory Visit (HOSPITAL_COMMUNITY): Payer: Self-pay

## 2022-12-13 ENCOUNTER — Telehealth: Payer: Self-pay

## 2022-12-13 NOTE — Telephone Encounter (Signed)
PA request received via CMM for Lubiprostone 24MCG capsules  PA submitted to [facility] and is pending determination.  Key: [id number]

## 2022-12-13 NOTE — Telephone Encounter (Signed)
PA has been APPROVED from 12/11/2022-12/09/2023

## 2022-12-15 ENCOUNTER — Telehealth: Payer: Self-pay | Admitting: Gastroenterology

## 2022-12-15 MED ORDER — TRULANCE 3 MG PO TABS: 1.0000 | ORAL_TABLET | Freq: Every day | ORAL | 6 refills | Status: DC

## 2022-12-15 NOTE — Telephone Encounter (Signed)
PT  has been waiting on an approval for Trulance. It was approved 3/17 and needs to be sent to the [facility] on [address].Please advise

## 2022-12-15 NOTE — Telephone Encounter (Signed)
Trulance sent to requested pharmacy

## 2022-12-22 ENCOUNTER — Encounter: Payer: Self-pay | Admitting: Gastroenterology

## 2022-12-22 ENCOUNTER — Ambulatory Visit (AMBULATORY_SURGERY_CENTER): Payer: BC Managed Care – PPO | Admitting: Gastroenterology

## 2022-12-22 VITALS — BP 128/78 | HR 63 | Temp 98.7°F | Resp 11 | Ht 62.0 in | Wt 244.0 lb

## 2022-12-22 DIAGNOSIS — Z9884 Bariatric surgery status: Secondary | ICD-10-CM

## 2022-12-22 DIAGNOSIS — D123 Benign neoplasm of transverse colon: Secondary | ICD-10-CM

## 2022-12-22 DIAGNOSIS — D509 Iron deficiency anemia, unspecified: Secondary | ICD-10-CM | POA: Diagnosis not present

## 2022-12-22 DIAGNOSIS — K581 Irritable bowel syndrome with constipation: Secondary | ICD-10-CM

## 2022-12-22 MED ORDER — SODIUM CHLORIDE 0.9 % IV SOLN: 500.0000 mL | INTRAVENOUS | Status: DC

## 2022-12-22 NOTE — Progress Notes (Unsigned)
Uneventful anesthetic. Report to pacu rn. Vss. Care resumed by rn. 

## 2022-12-22 NOTE — Progress Notes (Signed)
BS 64 upon arrival.  D5W  @ KVO initiated per standing order. Rechecked BS 15 minutes after infusion initiated and BS 70.   Denies any s/s of hypoglycemia.

## 2022-12-22 NOTE — Progress Notes (Unsigned)
Pt encourage to start prenatal vitamin per Dr Silverio Decamp as this may be tolerated better than the iron supplement that pt states she cannot tolerate.

## 2022-12-22 NOTE — Op Note (Signed)
[place] Patient Name: Brandy Lewis Procedure Date: 12/22/2022 8:02 AM MRN: [id number] Endoscopist: Mauri Pole , MD, [id number] Age: 62 Referring MD:  Date of Birth: [birth year] Gender: Female Account #: [id number] Procedure:                Upper GI endoscopy Indications:              Suspected upper gastrointestinal bleeding in                            patient with unexplained iron deficiency anemia Medicines:                Monitored Anesthesia Care Procedure:                Pre-Anesthesia Assessment:                           - Prior to the procedure, a History and Physical                            was performed, and patient medications and                            allergies were reviewed. The patient's tolerance of                            previous anesthesia was also reviewed. The risks                            and benefits of the procedure and the sedation                            options and risks were discussed with the patient.                            All questions were answered, and informed consent                            was obtained. Prior Anticoagulants: The patient has                            taken no anticoagulant or antiplatelet agents. ASA                            Grade Assessment: II - A patient with mild systemic                            disease. After reviewing the risks and benefits,                            the patient was deemed in satisfactory condition to                            undergo the procedure.  After obtaining informed consent, the endoscope was                            passed under direct vision. Throughout the                            procedure, the patient's blood pressure, pulse, and                            oxygen saturations were monitored continuously. The                            GIF [id number] was introduced through the                            mouth,  and advanced to the second part of duodenum.                            The upper GI endoscopy was accomplished without                            difficulty. The patient tolerated the procedure                            well. Scope In: Scope Out: Findings:                 The Z-line was regular and was found 36 cm from the                            incisors.                           The examined esophagus was normal.                           A suture granuloma was found in the cardia.                           Evidence of a gastric bypass was found. A gastric                            pouch was found. The staple line appeared intact.                            The gastrojejunal anastomosis was characterized by                            healthy appearing mucosa. This was traversed. The                            pouch-to-jejunum limb was characterized by healthy                            appearing mucosa. The jejunojejunal anastomosis  was                            characterized by healthy appearing mucosa.                           The examined jejunum was normal. Complications:            No immediate complications. Estimated Blood Loss:     Estimated blood loss was minimal. Impression:               - Z-line regular, 36 cm from the incisors.                           - Normal esophagus.                           - Suture granuloma in the stomach.                           - Gastric bypass with intact staple line.                            Gastrojejunal anastomosis characterized by healthy                            appearing mucosa.                           - Normal examined jejunum.                           - No specimens collected. Recommendation:           - Patient has a contact number available for                            emergencies. The signs and symptoms of potential                            delayed complications were discussed with the                             patient. Return to normal activities tomorrow.                            Written discharge instructions were provided to the                            patient.                           - Resume previous diet.                           - Continue present medications. Mauri Pole, MD 12/22/2022 8:56:50 AM This report has been signed electronically.

## 2022-12-22 NOTE — Progress Notes (Unsigned)
Called to room to assist during endoscopic procedure.  Patient ID and intended procedure confirmed with present staff. Received instructions for my participation in the procedure from the performing physician.  

## 2022-12-22 NOTE — Progress Notes (Signed)
[facility] History and Physical   Primary Care Physician:  Enid Skeens., Brandy   Reason for Procedure:  Iron deficiency anemia  Plan:    EGD and colonoscopy with possible interventions as needed     HPI: Brandy Lewis is a very pleasant 62 y.o. female here for EGD and colonoscopy for iron deficiency anemia.   The risks and benefits as well as alternatives of endoscopic procedure(s) have been discussed and reviewed. All questions answered. The patient agrees Lewis proceed.    Past Medical History:  Diagnosis Date   Allergy    Fatty liver    Gallstones     Past Surgical History:  Procedure Laterality Date   BARIATRIC SURGERY     BIOPSY  08/11/2018   Procedure: BIOPSY;  Surgeon: Mauri Pole, Brandy;  Location: [facility];  Service: Endoscopy;;   CHOLECYSTECTOMY     COLONOSCOPY WITH PROPOFOL [name] 08/11/2018   Procedure: COLONOSCOPY WITH PROPOFOL;  Surgeon: Mauri Pole, Brandy;  Location: [facility];  Service: Endoscopy;  Laterality: [name];   DILATION AND CURETTAGE OF UTERUS     ENDOMETRIAL ABLATION     GALLBLADDER SURGERY     GASTRIC BYPASS     POLYPECTOMY  08/11/2018   Procedure: POLYPECTOMY;  Surgeon: Mauri Pole, Brandy;  Location: [facility];  Service: Endoscopy;;    Prior Lewis Admission medications   Medication Sig Start Date End Date Taking? Authorizing Provider  BIOTIN PO Take 1 tablet by mouth daily.   Yes Provider, Historical, Brandy  Black Cohosh 20 MG TABS Take 2 tablets by mouth daily. 3000 mg   Yes Provider, Historical, Brandy  cetirizine (ZYRTEC) 5 MG chewable tablet Chew 5 mg by mouth daily.   Yes Provider, Historical, Brandy  Cyanocobalamin (B-12 PO) Take 1 tablet by mouth daily.   Yes Provider, Historical, Brandy  esomeprazole (NEXIUM) 40 MG capsule Take 1 capsule (40 mg total) by mouth daily before breakfast. 30 minutes before breafast 03/09/22  Yes Marice Angelino, Venia Minks, Brandy  famotidine (PEPCID) 40 MG tablet TAKE 1 TABLET BY MOUTH AT BEDTIME  11/22/22  Yes Tyrone Pautsch, Venia Minks, Brandy  fexofenadine (ALLEGRA ALLERGY) 180 MG tablet Take 180 mg by mouth daily.   Yes Provider, Historical, Brandy  FOLIC ACID PO Take 1 tablet by mouth daily.   Yes Provider, Historical, Brandy  Multiple Vitamins-Minerals (ADULT GUMMY) CHEW Chew 4 tablets by mouth daily.   Yes Provider, Historical, Brandy  Plecanatide (TRULANCE) 3 MG TABS Take 1 tablet (3 mg total) by mouth daily. 12/15/22  Yes Sundra Haddix, Venia Minks, Brandy  lubiprostone (AMITIZA) 24 MCG capsule Take 1 capsule (24 mcg total) by mouth 2 (two) times daily with a meal. Patient not taking: Reported on 12/09/2022 11/24/22   Mauri Pole, Brandy  [facility] 15 MG/0.5ML Pen SMARTSIG:1 Pre-Filled Pen Syringe SUB-Q Once a Week 08/30/22   Provider, Historical, Brandy    Current Outpatient Medications  Medication Sig Dispense Refill   BIOTIN PO Take 1 tablet by mouth daily.     Black Cohosh 20 MG TABS Take 2 tablets by mouth daily. 3000 mg     cetirizine (ZYRTEC) 5 MG chewable tablet Chew 5 mg by mouth daily.     Cyanocobalamin (B-12 PO) Take 1 tablet by mouth daily.     esomeprazole (NEXIUM) 40 MG capsule Take 1 capsule (40 mg total) by mouth daily before breakfast. 30 minutes before breafast 90 capsule 1   famotidine (PEPCID) 40 MG tablet TAKE 1 TABLET BY MOUTH  AT BEDTIME 30 tablet 0   fexofenadine (ALLEGRA ALLERGY) 180 MG tablet Take 180 mg by mouth daily.     FOLIC ACID PO Take 1 tablet by mouth daily.     Multiple Vitamins-Minerals (ADULT GUMMY) CHEW Chew 4 tablets by mouth daily.     Plecanatide (TRULANCE) 3 MG TABS Take 1 tablet (3 mg total) by mouth daily. 30 tablet 6   lubiprostone (AMITIZA) 24 MCG capsule Take 1 capsule (24 mcg total) by mouth 2 (two) times daily with a meal. (Patient not taking: Reported on 12/09/2022) 60 capsule 3   MOUNJARO 15 MG/0.5ML Pen SMARTSIG:1 Pre-Filled Pen Syringe SUB-Q Once a Week     Current Facility-Administered Medications  Medication Dose Route Frequency Provider Last Rate Last Admin    0.9 %  sodium chloride infusion  500 mL Intravenous Continuous Karime Scheuermann, Venia Minks, Brandy        Allergies as of 12/22/2022 - Review Complete 12/22/2022  Allergen Reaction Noted   Molnupiravir Hives 03/26/2021   Diflucan [fluconazole]  09/28/2017   Latex Rash 09/28/2017   Tape Rash 07/18/2018    Family History  Problem Relation Age of Onset   Lung cancer Mother    Diabetes Father    Heart disease Father    Colon cancer Maternal Uncle    Rectal cancer Neg Hx    Stomach cancer Neg Hx    Esophageal cancer Neg Hx     Social History   Socioeconomic History   Marital status: Married    Spouse name: Doctor, general practice   Number of children: 1   Years of education: Not on file   Highest education level: Not on file  Occupational History   Not on file  Tobacco Use   Smoking status: Never    Passive exposure: Yes   Smokeless tobacco: Never   Tobacco comments:    Significant second hand smoke exposure as a child  Vaping Use   Vaping Use: Never used  Substance and Sexual Activity   Alcohol use: No   Drug use: No   Sexual activity: Yes    Birth control/protection: None  Other Topics Concern   Not on file  Social History Narrative   Not on file   Social Determinants of Health   Financial Resource Strain: Not on file  Food Insecurity: Not on file  Transportation Needs: Not on file  Physical Activity: Not on file  Stress: Not on file  Social Connections: Not on file  Intimate Partner Violence: Not on file    Review of Systems:  All other review of systems negative except as mentioned in the HPI.  Physical Exam: Vital signs in last 24 hours: Blood Pressure 137/83   Pulse 64   Temperature 98.7 F (37.1 C)   Respiration 19   Height 5\' 2"  (1.575 m)   Weight 244 lb (110.7 kg)   Oxygen Saturation 97%   Body Mass Index 44.63 kg/m  General:   Alert, NAD Lungs:  Clear .   Heart:  Regular rate and rhythm Abdomen:  Soft, nontender and nondistended. Neuro/Psych:  Alert and  cooperative. Normal mood and affect. A and O x 3  Reviewed labs, radiology imaging, old records and pertinent past GI work up  Patient is appropriate for planned procedure(s) and anesthesia in an ambulatory setting   K. Denzil Magnuson , Brandy [phone]

## 2022-12-22 NOTE — Op Note (Addendum)
[facility] Patient Name: Brandy Lewis Procedure Date: 12/22/2022 8:02 AM MRN: [id number] Endoscopist: Mauri Pole , MD, [id number] Age: 62 Referring MD:  Date of Birth: [birth year] Gender: Female Account #: [id number] Procedure:                Colonoscopy Indications:              Unexplained iron deficiency anemia Medicines:                Monitored Anesthesia Care Procedure:                Pre-Anesthesia Assessment:                           - Prior to the procedure, a History and Physical                            was performed, and patient medications and                            allergies were reviewed. The patient's tolerance of                            previous anesthesia was also reviewed. The risks                            and benefits of the procedure and the sedation                            options and risks were discussed with the patient.                            All questions were answered, and informed consent                            was obtained. Prior Anticoagulants: The patient has                            taken no anticoagulant or antiplatelet agents. ASA                            Grade Assessment: II - A patient with mild systemic                            disease. After reviewing the risks and benefits,                            the patient was deemed in satisfactory condition to                            undergo the procedure.                           After obtaining informed consent, the colonoscope  was passed under direct vision. Throughout the                            procedure, the patient's blood pressure, pulse, and                            oxygen saturations were monitored continuously. The                            Olympus [id number]) Colonoscope was                            introduced through the anus and advanced to the the                            cecum, identified  by appendiceal orifice and                            ileocecal valve. The colonoscopy was performed                            without difficulty. The patient tolerated the                            procedure well. The quality of the bowel                            preparation was good. The ileocecal valve,                            appendiceal orifice, and rectum were photographed. Scope In: 8:25:48 AM Scope Out: 8:40:33 AM Scope Withdrawal Time: 0 hours 10 minutes 12 seconds  Total Procedure Duration: 0 hours 14 minutes 45 seconds  Findings:                 The perianal and digital rectal examinations were                            normal.                           A 4 mm polyp was found in the hepatic flexure. The                            polyp was sessile. The polyp was removed with a                            cold snare. Resection and retrieval were complete.                           Anal papilla(e) were hypertrophied.                           The exam was otherwise without abnormality. Complications:  No immediate complications. Estimated Blood Loss:     Estimated blood loss was minimal. Impression:               - One 4 mm polyp at the hepatic flexure, removed                            with a cold snare. Resected and retrieved.                           - Anal papilla(e) were hypertrophied.                           - The examination was otherwise normal.                           - The GI Genius (intelligent endoscopy module),                            computer-aided polyp detection system powered by AI                            was utilized to detect colorectal polyps through                            enhanced visualization during colonoscopy. Recommendation:           - Patient has a contact number available for                            emergencies. The signs and symptoms of potential                            delayed complications were discussed with  the                            patient. Return to normal activities tomorrow.                            Written discharge instructions were provided to the                            patient.                           - Resume previous diet.                           - Continue present medications.                           - Await pathology results.                           - Repeat colonoscopy in 5-10 years for surveillance  based on pathology results. Mauri Pole, MD 12/22/2022 8:46:54 AM This report has been signed electronically.

## 2022-12-22 NOTE — Patient Instructions (Addendum)
Continue present medications Return to previous diet Await pathology results Handout given for polyps.  YOU HAD AN ENDOSCOPIC PROCEDURE TODAY AT [facility]:   Refer to the procedure report that was given to you for any specific questions about what was found during the examination.  If the procedure report does not answer your questions, please call your gastroenterologist to clarify.  If you requested that your care partner not be given the details of your procedure findings, then the procedure report has been included in a sealed envelope for you to review at your convenience later.  YOU SHOULD EXPECT: Some feelings of bloating in the abdomen. Passage of more gas than usual.  Walking can help get rid of the air that was put into your GI tract during the procedure and reduce the bloating. If you had a lower endoscopy (such as a colonoscopy or flexible sigmoidoscopy) you may notice spotting of blood in your stool or on the toilet paper. If you underwent a bowel prep for your procedure, you may not have a normal bowel movement for a few days.  Please Note:  You might notice some irritation and congestion in your nose or some drainage.  This is from the oxygen used during your procedure.  There is no need for concern and it should clear up in a day or so.  SYMPTOMS TO REPORT IMMEDIATELY:  Following lower endoscopy (colonoscopy):  Excessive amounts of blood in the stool  Significant tenderness or worsening of abdominal pains  Swelling of the abdomen that is new, acute  Fever of 100F or higher  Following upper endoscopy (EGD)  Vomiting of blood or coffee ground material  New chest pain or pain under the shoulder blades  Painful or persistently difficult swallowing  New shortness of breath  Black, tarry-looking stools  For urgent or emergent issues, a gastroenterologist can be reached at any hour by calling [phone]. Do not use MyChart messaging for urgent concerns.    DIET:  We do recommend a small meal at first, but then you may proceed to your regular diet.  Drink plenty of fluids but you should avoid alcoholic beverages for 24 hours.  ACTIVITY:  You should plan to take it easy for the rest of today and you should NOT DRIVE or use heavy machinery until tomorrow (because of the sedation medicines used during the test).    FOLLOW UP: Our staff will call the number listed on your records the next business day following your procedure.  We will call around 7:15- 8:00 am to check on you and address any questions or concerns that you may have regarding the information given to you following your procedure. If we do not reach you, we will leave a message.     If any biopsies were taken you will be contacted by phone or by letter within the next 1-3 weeks.  Please call us at [phone] if you have not heard about the biopsies in 3 weeks.    SIGNATURES/CONFIDENTIALITY: You and/or your care partner have signed paperwork which will be entered into your electronic medical record.  These signatures attest to the fact that that the information above on your After Visit Summary has been reviewed and is understood.  Full responsibility of the confidentiality of this discharge information lies with you and/or your care-partner.

## 2022-12-23 ENCOUNTER — Telehealth: Payer: Self-pay

## 2022-12-23 NOTE — Telephone Encounter (Signed)
Follow up call to pt, no answer.  

## 2023-01-02 ENCOUNTER — Other Ambulatory Visit: Payer: Self-pay | Admitting: Gastroenterology

## 2023-01-06 ENCOUNTER — Encounter: Payer: Self-pay | Admitting: Gastroenterology

## 2023-01-27 ENCOUNTER — Other Ambulatory Visit: Payer: Self-pay | Admitting: Gastroenterology

## 2023-01-31 ENCOUNTER — Other Ambulatory Visit: Payer: Self-pay | Admitting: Gastroenterology

## 2023-02-03 ENCOUNTER — Other Ambulatory Visit (HOSPITAL_COMMUNITY): Payer: Self-pay

## 2023-02-04 NOTE — Telephone Encounter (Signed)
Medication changed to Trulance

## 2023-02-07 ENCOUNTER — Telehealth: Payer: Self-pay

## 2023-02-07 NOTE — Telephone Encounter (Signed)
PA request received via CMM for Esomeprazole Magnesium 40MG  dr capsules  PA submitted to [facility] and is pending determination  Key: [id number]

## 2023-02-08 NOTE — Telephone Encounter (Signed)
PA has been DENIED:  Denied. This health benefit plan does not cover the following services, supplies, drugs or charges: Any drug that is therapeutically equivalent to an over-the-counter drug where the over-the-counter products contain the same active ingredients as the prescription product at the same, or similar, strengths. -OR- Drugs that are Purchased over-the-counter, unless specifically listed as a covered drug in the formulary and a written prescription is provided

## 2023-02-11 ENCOUNTER — Telehealth: Payer: Self-pay | Admitting: Gastroenterology

## 2023-02-11 NOTE — Telephone Encounter (Signed)
Patient is calling states the Trulance is not working for her and is seeking alternatives. Please advise

## 2023-02-11 NOTE — Telephone Encounter (Signed)
Please send prescription for Motegrity 2 mg daily.  Please advise patient to stop Trulance once he starts taking Motegrity.  Call if she continues to have persistent constipation.  Please schedule follow-up visit with me or APP next available.  Thanks

## 2023-02-11 NOTE — Telephone Encounter (Signed)
She has had gradual worsening of the constipation. At first Trulance seemed to work. Soon she had to add one stool softener, the 2 stool softener. Today was day 3 without a bowel movement. She took half a bottle of mag citrate and an enema to produce a bowel movement. The patient would like to try something different. (Confirmed pharmacy [facility] on [address])

## 2023-02-14 ENCOUNTER — Other Ambulatory Visit: Payer: Self-pay

## 2023-02-14 MED ORDER — MOTEGRITY 2 MG PO TABS: 2.0000 mg | ORAL_TABLET | Freq: Every day | ORAL | 3 refills | Status: DC

## 2023-02-14 NOTE — Telephone Encounter (Signed)
Inbound call from patient needing a PA for medication Montegrity. Please advise.  Thank you

## 2023-02-14 NOTE — Telephone Encounter (Signed)
Spoke to the patient. Patient made aware to start Motegrity and stop Trulance. Prescription sent. No appts available at this time.

## 2023-02-18 NOTE — Telephone Encounter (Signed)
Inbound call from patient f/u on PA 

## 2023-02-20 ENCOUNTER — Other Ambulatory Visit (HOSPITAL_COMMUNITY): Payer: Self-pay

## 2023-02-20 ENCOUNTER — Telehealth: Payer: Self-pay | Admitting: Pharmacy Technician

## 2023-02-20 NOTE — Telephone Encounter (Signed)
Patient Advocate Encounter  Received notification from [facility] that prior authorization for MOTEGRITY 2MG  is required.   PA submitted on 5.26.24 Key [id number] Status is pending

## 2023-02-22 NOTE — Telephone Encounter (Signed)
PA has been DENIED, denial letter has been attached in patients documents/media 

## 2023-02-23 NOTE — Telephone Encounter (Signed)
See other phone note, It seems patient was denied Motegrity

## 2023-02-23 NOTE — Telephone Encounter (Signed)
Inbound call from patient in regards to the prior auth denial. advising of a [phone] number to call from insurance appeal. Please advise.  Thank you

## 2023-02-24 NOTE — Telephone Encounter (Signed)
No Ill call Brandy Lewis I took this out of your box yesterday

## 2023-02-25 ENCOUNTER — Ambulatory Visit
Admission: EM | Admit: 2023-02-25 | Discharge: 2023-02-25 | Disposition: A | Discharge status: Home / Self Care | Payer: BC Managed Care – PPO | Attending: Internal Medicine | Admitting: Internal Medicine

## 2023-02-25 DIAGNOSIS — J029 Acute pharyngitis, unspecified: Secondary | ICD-10-CM | POA: Diagnosis present

## 2023-02-25 LAB — POCT RAPID STREP A (OFFICE): Rapid Strep A Screen: NEGATIVE

## 2023-02-25 MED ORDER — CHLORASEPTIC 1.4 % MT LIQD: 1.0000 | OROMUCOSAL | 0 refills | Status: DC | PRN

## 2023-02-25 NOTE — Discharge Instructions (Signed)
Strep was negative.  Throat culture is pending.  I have prescribed a throat spray to help alleviate symptoms.  Please follow-up if any symptoms persist or worsen.

## 2023-02-25 NOTE — ED Provider Notes (Signed)
[facility]    CSN: [id number] Arrival date & time: 02/25/23  1209      History   Chief Complaint Chief Complaint  Patient presents with   Sore Throat    HPI Brandy Lewis is a 62 y.o. female.   Patient presents with headache and sore throat that started yesterday.  Reports very mild nonproductive cough.  Denies nasal congestion or runny nose.  Denies any fever or known sick contacts.  Patient is taking Tylenol for headache.  Patient reports she is concerned that symptoms could be related to liquids found in her car recently.  Reports that she got the car about 5 months ago and started noticing that there was a liquid in the passenger floorboard.  She was told that the EPA is investigating and that it was an "organism" but she is not sure what it was.  She is not sure what it is or if it is related to symptoms.  Denies chest pain or shortness of breath.   Sore Throat    Past Medical History:  Diagnosis Date   Allergy    Fatty liver    Gallstones     Patient Active Problem List   Diagnosis Date Noted   Iron deficiency anemia 09/21/2022   H/O gastric bypass 09/21/2022   Hemorrhoids 09/21/2022   BRBPR (bright red blood per rectum)    Morbid obesity with body mass index (BMI) greater than or equal to 50 [facility])    Rectal pain     Past Surgical History:  Procedure Laterality Date   BARIATRIC SURGERY     BIOPSY  08/11/2018   Procedure: BIOPSY;  Surgeon: Napoleon Form, MD;  Location: [facility];  Service: Endoscopy;;   CHOLECYSTECTOMY     COLONOSCOPY WITH PROPOFOL [name] 08/11/2018   Procedure: COLONOSCOPY WITH PROPOFOL;  Surgeon: Napoleon Form, MD;  Location: [facility];  Service: Endoscopy;  Laterality: [name];   DILATION AND CURETTAGE OF UTERUS     ENDOMETRIAL ABLATION     GALLBLADDER SURGERY     GASTRIC BYPASS     POLYPECTOMY  08/11/2018   Procedure: POLYPECTOMY;  Surgeon: Napoleon Form, MD;  Location: [facility];  Service: Endoscopy;;     OB History   No obstetric history on file.      Home Medications    Prior to Admission medications   Medication Sig Start Date End Date Taking? Authorizing Provider  phenol (CHLORASEPTIC) 1.4 % LIQD Use as directed 1 spray in the mouth or throat as needed for throat irritation / pain. 02/25/23  Yes Raelie Lohr, Rolly Salter E, FNP  BIOTIN PO Take 1 tablet by mouth daily.    [provider]  Black Cohosh 20 MG TABS Take 2 tablets by mouth daily. 3000 mg    [provider]  cetirizine (ZYRTEC) 5 MG chewable tablet Chew 5 mg by mouth daily.    [provider]  Cyanocobalamin (B-12 PO) Take 1 tablet by mouth daily.    [provider]  esomeprazole (NEXIUM) 40 MG capsule TAKE 1 CAPSULE BY MOUTH ONCE DAILY AT  12  NOON  30  MINUTES  BEFORE  BREAKFAST 01/27/23   Nandigam, Eleonore Chiquito, MD  famotidine (PEPCID) 40 MG tablet TAKE 1 TABLET BY MOUTH AT BEDTIME 01/31/23   Nandigam, Eleonore Chiquito, MD  fexofenadine (ALLEGRA ALLERGY) 180 MG tablet Take 180 mg by mouth daily.    [provider]  FOLIC ACID PO Take 1 tablet by mouth daily.  [provider]  lubiprostone (AMITIZA) 24 MCG capsule Take 1 capsule (24 mcg total) by mouth 2 (two) times daily with a meal. Patient not taking: Reported on 12/09/2022 11/24/22   Napoleon Form, MD  [facility] 15 MG/0.5ML Pen SMARTSIG:1 Pre-Filled Pen Syringe SUB-Q Once a Week 08/30/22   [provider]  Multiple Vitamins-Minerals (ADULT GUMMY) CHEW Chew 4 tablets by mouth daily.    [provider]  Prenatal Vit-Fe Fumarate-FA (MULTIVITAMIN-PRENATAL) 27-0.8 MG TABS tablet Take 1 tablet by mouth daily at 12 noon.    [provider]  Prucalopride Succinate (MOTEGRITY) 2 MG TABS Take 1 tablet (2 mg total) by mouth daily. 02/14/23   Napoleon Form, MD    Family History Family History  Problem Relation Age of Onset   Lung cancer Mother    Diabetes Father    Heart disease Father    Colon cancer  Maternal Uncle    Rectal cancer Neg Hx    Stomach cancer Neg Hx    Esophageal cancer Neg Hx     Social History Social History   Tobacco Use   Smoking status: Never    Passive exposure: Yes   Smokeless tobacco: Never   Tobacco comments:    Significant second hand smoke exposure as a child  Vaping Use   Vaping Use: Never used  Substance Use Topics   Alcohol use: No   Drug use: No     Allergies   Molnupiravir, Diflucan [fluconazole], Latex, and Tape   Review of Systems Review of Systems Per HPI  Physical Exam Triage Vital Signs ED Triage Vitals  Enc Vitals Group     BP 02/25/23 1234 139/77     Pulse Rate 02/25/23 1232 67     Resp 02/25/23 1232 16     Temp 02/25/23 1232 97.9 F (36.6 C)     Temp Source 02/25/23 1232 Oral     SpO2 02/25/23 1232 98 %     Weight --      Height --      Head Circumference --      Peak Flow --      Pain Score 02/25/23 1233 5     Pain Loc --      Pain Edu? --      Excl. in GC? --    No data found.  Updated Vital Signs BP 139/77 (BP Location: Left Arm)   Pulse 67   Temp 97.9 F (36.6 C) (Oral)   Resp 16   SpO2 98%   Visual Acuity Right Eye Distance:   Left Eye Distance:   Bilateral Distance:    Right Eye Near:   Left Eye Near:    Bilateral Near:     Physical Exam Constitutional:      General: She is not in acute distress.    Appearance: Normal appearance. She is not toxic-appearing or diaphoretic.  HENT:     Head: Normocephalic and atraumatic.     Right Ear: Tympanic membrane and ear canal normal.     Left Ear: Tympanic membrane and ear canal normal.     Nose: No congestion.     Mouth/Throat:     Mouth: Mucous membranes are moist.     Pharynx: Posterior oropharyngeal erythema present.  Eyes:     Extraocular Movements: Extraocular movements intact.     Conjunctiva/sclera: Conjunctivae normal.     Pupils: Pupils are equal, round, and reactive to light.  Cardiovascular:     Rate and  Rhythm: Normal rate and  regular rhythm.     Pulses: Normal pulses.     Heart sounds: Normal heart sounds.  Pulmonary:     Effort: Pulmonary effort is normal. No respiratory distress.     Breath sounds: Normal breath sounds. No stridor. No wheezing, rhonchi or rales.  Abdominal:     General: Abdomen is flat. Bowel sounds are normal.     Palpations: Abdomen is soft.  Musculoskeletal:        General: Normal range of motion.     Cervical back: Normal range of motion.  Skin:    General: Skin is warm and dry.  Neurological:     General: No focal deficit present.     Mental Status: She is alert and oriented to person, place, and time. Mental status is at baseline.  Psychiatric:        Mood and Affect: Mood normal.        Behavior: Behavior normal.      UC Treatments / Results  Labs (all labs ordered are listed, but only abnormal results are displayed) Labs Reviewed  CULTURE, GROUP A STREP [facility])  POCT RAPID STREP A (OFFICE)    EKG   Radiology No results found.  Procedures Procedures (including critical care time)  Medications Ordered in UC Medications - No data to display  Initial Impression / Assessment and Plan / UC Course  I have reviewed the triage vital signs and the nursing notes.  Pertinent labs & imaging results that were available during my care of the patient were reviewed by me and considered in my medical decision making (see chart for details).     Differential diagnoses include allergic related symptoms versus viral illness.  Rapid strep was negative.  Throat culture pending.  Patient declined COVID testing.  I have a low suspicion that patient's symptoms are related to recent liquid found in car given symptoms should have been present a lot longer than 1 day.  Although, recommended chest x-ray but patient declined this.  Will send Chloraseptic spray to take as needed for throat pain and patient was advised of supportive care and symptom management. Patient already taking daily  antihistamine.  Advised strict follow-up precautions.  Patient verbalized understanding and was agreeable with plan Final Clinical Impressions(s) / UC Diagnoses   Final diagnoses:  Sore throat     Discharge Instructions      Strep was negative.  Throat culture is pending.  I have prescribed a throat spray to help alleviate symptoms.  Please follow-up if any symptoms persist or worsen.     ED Prescriptions     Medication Sig Dispense Auth. Provider   phenol (CHLORASEPTIC) 1.4 % LIQD Use as directed 1 spray in the mouth or throat as needed for throat irritation / pain. 118 mL Gustavus Bryant, [place]      PDMP not reviewed this encounter.   Gustavus Bryant, [place] 02/25/23 1331

## 2023-02-25 NOTE — ED Triage Notes (Signed)
Pt states headache and sore throat since yesterday states she was exposed to some sort of mold in her car over the past 5 months.

## 2023-02-26 LAB — CULTURE, GROUP A STREP (THRC)

## 2023-02-27 LAB — CULTURE, GROUP A STREP (THRC)

## 2023-02-28 LAB — CULTURE, GROUP A STREP (THRC)

## 2023-03-11 NOTE — Telephone Encounter (Addendum)
I called [employer] at [phone] She said the dx needed to be: constipation long term of unknown origin before approved.   Or Idiopathic constipation.     We have to submit records and fax to [phone] with the ref case number #[id number]  I spoke with Dorathy Kinsman.    Dr Lavon Paganini please advise... It looks like she has IBS with Constipation her insurance is not approving with that diagnosis. It seems that's the case with all the constipation meds.   Has she ever been diagnosed with idiopathic constipation?

## 2023-03-15 NOTE — Telephone Encounter (Signed)
Can we resubmit with chronic idiopathic constipation as diagnosis?  Thank you

## 2023-03-16 ENCOUNTER — Encounter: Payer: Self-pay | Admitting: Pharmacy Technician

## 2023-03-16 ENCOUNTER — Encounter: Payer: Self-pay | Admitting: *Deleted

## 2023-03-16 DIAGNOSIS — K5904 Chronic idiopathic constipation: Secondary | ICD-10-CM | POA: Insufficient documentation

## 2023-03-16 NOTE — Telephone Encounter (Signed)
I called the patient and she said her constipation is actually doing better since stopping one of her supplements she is not concerned with getting the medication now. I told her if she had problems again to call me and I could make her an office visit .  She is fine for now.

## 2023-03-17 NOTE — Telephone Encounter (Signed)
ok 

## 2023-03-28 ENCOUNTER — Other Ambulatory Visit: Payer: Self-pay | Admitting: Gastroenterology

## 2023-04-18 ENCOUNTER — Encounter: Payer: BC Managed Care – PPO | Admitting: Gastroenterology

## 2023-05-12 ENCOUNTER — Encounter: Payer: Self-pay | Admitting: Internal Medicine

## 2023-05-12 ENCOUNTER — Inpatient Hospital Stay (HOSPITAL_BASED_OUTPATIENT_CLINIC_OR_DEPARTMENT_OTHER): Payer: BC Managed Care – PPO | Admitting: Internal Medicine

## 2023-05-12 ENCOUNTER — Inpatient Hospital Stay: Payer: BC Managed Care – PPO | Attending: Internal Medicine

## 2023-05-12 VITALS — BP 123/78 | HR 72 | Temp 97.3°F | Resp 18 | Ht 62.0 in | Wt 231.5 lb

## 2023-05-12 DIAGNOSIS — D509 Iron deficiency anemia, unspecified: Secondary | ICD-10-CM | POA: Diagnosis present

## 2023-05-12 DIAGNOSIS — Z9884 Bariatric surgery status: Secondary | ICD-10-CM

## 2023-05-12 DIAGNOSIS — Z800 Family history of malignant neoplasm of digestive organs: Secondary | ICD-10-CM | POA: Insufficient documentation

## 2023-05-12 DIAGNOSIS — H9202 Otalgia, left ear: Secondary | ICD-10-CM | POA: Diagnosis not present

## 2023-05-12 DIAGNOSIS — Z86010 Personal history of colonic polyps: Secondary | ICD-10-CM | POA: Insufficient documentation

## 2023-05-12 DIAGNOSIS — R946 Abnormal results of thyroid function studies: Secondary | ICD-10-CM | POA: Diagnosis not present

## 2023-05-12 LAB — CBC WITH DIFFERENTIAL/PLATELET
Abs Immature Granulocytes: 0.01 10*3/uL (ref 0.00–0.07)
Basophils Absolute: 0.1 10*3/uL (ref 0.0–0.1)
Basophils Relative: 1 %
Eosinophils Absolute: 0.4 10*3/uL (ref 0.0–0.5)
Eosinophils Relative: 6 %
HCT: 40.3 % (ref 36.0–46.0)
Hemoglobin: 13.3 g/dL (ref 12.0–15.0)
Immature Granulocytes: 0 %
Lymphocytes Relative: 34 %
Lymphs Abs: 2.2 10*3/uL (ref 0.7–4.0)
MCH: 28.9 pg (ref 26.0–34.0)
MCHC: 33 g/dL (ref 30.0–36.0)
MCV: 87.4 fL (ref 80.0–100.0)
Monocytes Absolute: 0.6 10*3/uL (ref 0.1–1.0)
Monocytes Relative: 9 %
Neutro Abs: 3.2 10*3/uL (ref 1.7–7.7)
Neutrophils Relative %: 50 %
Platelets: 270 10*3/uL (ref 150–400)
RBC: 4.61 MIL/uL (ref 3.87–5.11)
RDW: 12.2 % (ref 11.5–15.5)
WBC: 6.5 10*3/uL (ref 4.0–10.5)
nRBC: 0 % (ref 0.0–0.2)

## 2023-05-12 LAB — FERRITIN: Ferritin: 73 ng/mL (ref 11–307)

## 2023-05-12 LAB — IRON AND TIBC
Iron: 77 ug/dL (ref 28–170)
Saturation Ratios: 22 % (ref 10.4–31.8)
TIBC: 344 ug/dL (ref 250–450)
UIBC: 267 ug/dL

## 2023-05-12 NOTE — Progress Notes (Signed)
Patient states she's having issues with her allergies that she'd like to discuss.   Patient also had an MRI done that showed mastoid air cell disease and states ENT said they weren't worried at this time because she has not had any ear infections.

## 2023-05-12 NOTE — Progress Notes (Signed)
[facility]  [phone] [phone]  ID: [name] OB: [birth year]  MR#: [id number]  QIO#:[id number]  Patient Care Team: Nonnie Done., MD as PCP - General (Family Medicine) Michaelyn Barter, MD as Consulting Physician (Oncology)  REFERRING PROVIDER: Dr. Cheri Rous  REASON FOR REFERRAL: IDA  HPI: Brandy Lewis is a 62 y.o. female with past medical history of obesity, prediabetes and gastric bypass surgery was referred to hematology for management of iron deficiency anemia.  Patient has a history of hemorrhoids.  She started [facility] for weight loss in May 2023.  Since has noticed more constipation and mild bleeding from hemorrhoids. Patient had labs done on 09/05/2022.  Iron 16, saturation 4%.  Ferritin not done.  WBC 4.8, hemoglobin 13.1, MCV 82, platelet 267.  B12 830.  Folate>24.  Last colonoscopy done on 08/11/2018 at [facility] by Dr. Marsa Aris showed 1 polyp in ascending colon, diverticuli and nonbleeding internal hemorrhoids.  She started iron pills but could not tolerate due to worsening constipation.  Completed 2 doses of IV Feraheme in January 2024.  Interval history Patient was seen today for iron deficiency anemia, labs. She has been dealing with allergy issues.  Her car was having a leak and had mold grown which she was driving for several months.  Her allergies have worsened.  She was also having left ear pulsatile tinnitus was evaluated by ENT. Had an MRI which showed some evidence of mastoid disease.  She also had allergy testing and was positive for multiple environmental things.  She was tested for 3 or 4 different kind of molds which was all negative.  She is currently taking cetirizine and Singulair but still not complete improvement in symptoms.  REVIEW OF SYSTEMS:   ROS  As per HPI. Otherwise, a complete review of systems is negative.  PAST MEDICAL HISTORY: Past Medical History:  Diagnosis Date   Allergy     Chronic constipation    Fatty liver    Gallstones     PAST SURGICAL HISTORY: Past Surgical History:  Procedure Laterality Date   BARIATRIC SURGERY     BIOPSY  08/11/2018   Procedure: BIOPSY;  Surgeon: Napoleon Form, MD;  [facility];  Service: Endoscopy;;   CHOLECYSTECTOMY     COLONOSCOPY WITH PROPOFOL [name] 08/11/2018   Procedure: COLONOSCOPY WITH PROPOFOL;  Surgeon: Napoleon Form, MD;  Location: [facility];  Service: Endoscopy;  Laterality: [name];   DILATION AND CURETTAGE OF UTERUS     ENDOMETRIAL ABLATION     GALLBLADDER SURGERY     GASTRIC BYPASS     POLYPECTOMY  08/11/2018   Procedure: POLYPECTOMY;  Surgeon: Napoleon Form, MD;  [facility];  Service: Endoscopy;;    FAMILY HISTORY: Family History  Problem Relation Age of Onset   Lung cancer Mother    Diabetes Father    Heart disease Father    Colon cancer Maternal Uncle    Rectal cancer Neg Hx    Stomach cancer Neg Hx    Esophageal cancer Neg Hx     HEALTH MAINTENANCE: Social History   Tobacco Use   Smoking status: Never    Passive exposure: Yes   Smokeless tobacco: Never   Tobacco comments:    Significant second hand smoke exposure as a child  Vaping Use   Vaping status: Never Used  Substance Use Topics   Alcohol use: No   Drug use: No     Allergies  Allergen Reactions  Molnupiravir Hives   Diflucan [Fluconazole]     Causes yeast infections    Latex Rash   Tape Rash    Current Outpatient Medications  Medication Sig Dispense Refill   BIOTIN PO Take 1 tablet by mouth daily.     Black Cohosh 20 MG TABS Take 2 tablets by mouth daily. 3000 mg     cetirizine (ZYRTEC) 5 MG chewable tablet Chew 5 mg by mouth daily.     Cyanocobalamin (B-12 PO) Take 1 tablet by mouth daily.     esomeprazole (NEXIUM) 40 MG capsule TAKE 1 CAPSULE BY MOUTH ONCE DAILY AT  NOON  TAKE  30  MINUTES  BEFORE  BREAKFAST 90 capsule 0   famotidine (PEPCID) 40 MG tablet TAKE 1 TABLET BY MOUTH  AT BEDTIME 30 tablet 11   FOLIC ACID PO Take 1 tablet by mouth daily.     MOUNJARO 15 MG/0.5ML Pen SMARTSIG:1 Pre-Filled Pen Syringe SUB-Q Once a Week     Multiple Vitamins-Minerals (ADULT GUMMY) CHEW Chew 4 tablets by mouth daily.     Prenatal Vit-Fe Fumarate-FA (MULTIVITAMIN-PRENATAL) 27-0.8 MG TABS tablet Take 1 tablet by mouth daily at 12 noon.     TRULANCE 3 MG TABS Take 1 tablet by mouth daily.     fexofenadine (ALLEGRA ALLERGY) 180 MG tablet Take 180 mg by mouth daily. (Patient not taking: Reported on 05/12/2023)     lubiprostone (AMITIZA) 24 MCG capsule Take 1 capsule (24 mcg total) by mouth 2 (two) times daily with a meal. (Patient not taking: Reported on 12/09/2022) 60 capsule 3   phenol (CHLORASEPTIC) 1.4 % LIQD Use as directed 1 spray in the mouth or throat as needed for throat irritation / pain. (Patient not taking: Reported on 05/12/2023) 118 mL 0   Prucalopride Succinate (MOTEGRITY) 2 MG TABS Take 1 tablet (2 mg total) by mouth daily. (Patient not taking: Reported on 05/12/2023) 30 tablet 3   No current facility-administered medications for this visit.    OBJECTIVE: Vitals:   05/12/23 1359  BP: 123/78  Pulse: 72  Resp: 18  Temp: (!) 97.3 F (36.3 C)  SpO2: 99%      Body mass index is 42.34 kg/m.      General: Well-developed, well-nourished, no acute distress. Eyes: Pink conjunctiva, anicteric sclera. HEENT: Normocephalic, moist mucous membranes, clear oropharnyx. Lungs: Clear to auscultation bilaterally. Heart: Regular rate and rhythm. No rubs, murmurs, or gallops. Abdomen: Soft, nontender, nondistended. No organomegaly noted, normoactive bowel sounds. Musculoskeletal: No edema, cyanosis, or clubbing. Neuro: Alert, answering all questions appropriately. Cranial nerves grossly intact. Skin: No rashes or petechiae noted. Psych: Normal affect. Lymphatics: No cervical, calvicular, axillary or inguinal LAD.   LAB RESULTS:  Lab Results  Component Value Date   NA 141  12/06/2019   K 4.2 12/06/2019   CL 110 12/06/2019   CO2 22 12/06/2019   GLUCOSE 114 (H) 12/06/2019   BUN 21 (H) 12/06/2019   CREATININE 0.87 12/06/2019   CALCIUM 9.1 12/06/2019   GFRNONAA >60 12/06/2019   GFRAA >60 12/06/2019    Lab Results  Component Value Date   WBC 6.5 05/12/2023   NEUTROABS 3.2 05/12/2023   HGB 13.3 05/12/2023   HCT 40.3 05/12/2023   MCV 87.4 05/12/2023   PLT 270 05/12/2023    Lab Results  Component Value Date   TIBC 346 12/06/2022   FERRITIN 101 12/06/2022   IRONPCTSAT 33 (H) 12/06/2022     STUDIES: No results found.  ASSESSMENT AND PLAN:  Brandy Lewis is a 62 y.o. female with pmh of obesity, prediabetes was referred to hematology for management of iron deficiency anemia.  # Iron deficiency anemia #Gastric bypass surgery  -Likely malabsorption from gastric bypass surgery.  Upper endoscopy from 12/19/2022 with Dr. Marsa Aris showed changes from gastric bypass surgery but otherwise unremarkable.  Colonoscopy showed one 4 mm polyp at the hepatic flexure.  Path showed tubular adenoma.   -Labs from December 2023 showed saturation 4%, ferritin was not available and normal hemoglobin.  Received IV Feraheme x 2 doses.  Iron panel and hemoglobin normal.  excellent response to iron infusion.   -CBC with differential today show normal hemoglobin of 13.3.  Iron panel is pending.  Will call the patient if iron is low and will schedule for 2 doses of IV Feraheme.  Continue with iron supplements.  She is tolerating well. -B12 level is pending.  # Abnormal TFTs -Per patient, she was found to have abnormal TFT and would prefer repeat testing.  -TFTs are pending.  # Environmental allergies -Following with Dr. Verne Spurr, ENT.  Had allergy testing done and was positive for multiple environmental exposure. -On antihistamine and Singulair.  Moderate improvement in symptoms.  # Left ear pulsatile pain -Seen by ENT.  Of chart, had MRI which showed mastoid  disease but no other acute concerning symptoms.  Orders Placed This Encounter  Procedures   CBC with Differential/Platelet   Iron and TIBC   Ferritin   Vitamin B12    RTC in 5 months for MD visit, labs  Patient expressed understanding and was in agreement with this plan. She also understands that She can call clinic at any time with any questions, concerns, or complaints.   I spent a total of 25 minutes reviewing chart data, face-to-face evaluation with the patient, counseling and coordination of care as detailed above.  Michaelyn Barter, MD   05/12/2023 2:31 PM

## 2023-05-13 ENCOUNTER — Telehealth: Payer: Self-pay | Admitting: *Deleted

## 2023-05-13 LAB — THYROID PANEL WITH TSH
Free Thyroxine Index: 2 (ref 1.2–4.9)
T3 Uptake Ratio: 26 % (ref 24–39)
T4, Total: 7.5 ug/dL (ref 4.5–12.0)
TSH: 2.02 u[IU]/mL (ref 0.450–4.500)

## 2023-05-13 LAB — VITAMIN B12: Vitamin B-12: 1309 pg/mL — ABNORMAL HIGH (ref 180–914)

## 2023-05-13 NOTE — Telephone Encounter (Signed)
Patient called reporting that she sees her results in MyChart and they are abnormal. She is asking if she needs to come back in to be seen. Please advise  Thyroid Panel With TSH Order: [id number] Status: Final result     Visible to patient: Yes (seen)     Next appt: 11/11/2023 at 01:00 PM in Oncology ([facility])     Dx: Abnormal thyroid function test; H/O g...   0 Result Notes    Component Ref Range & Units 1 d ago  TSH 0.450 - 4.500 uIU/mL 2.020  T4, Total 4.5 - 12.0 ug/dL 7.5  T3 Uptake Ratio 24 - 39 % 26  Free Thyroxine Index 1.2 - 4.9 2.0  Comment: (NOTE) Performed At: [facility] [address] [place], [place] [id number] Jolene Schimke MD NW:[id number]  Resulting Agency [facility] CLIN LAB         Specimen Collected: 05/12/23 13:33 Last Resulted: 05/13/23 08:37      Lab Flowsheet      Order Details      View Encounter      Lab and Collection Details      Routing      Result History    View All Conversations on this Encounter        Result Care Coordination   Patient Communication   Add Comments   Seen Back to Top    Other Results from 05/12/2023  Ferritin Order: [id number] Status: Final result      Visible to patient: Yes (seen)      Next appt: 11/11/2023 at 01:00 PM in Oncology ([facility])      Dx: H/O gastric bypass; Iron deficiency a...    0 Result Notes       Component Ref Range & Units 1 d ago 5 mo ago  Ferritin 11 - 307 ng/mL 73 101 CM  Comment: Performed at [facility], [address] [place], [place] [address]  Resulting Agency [facility] CLIN LAB [facility] CLIN LAB         Specimen Collected: 05/12/23 13:33 Last Resulted: 05/12/23 15:01      Lab Flowsheet       Order Details       View Encounter       Lab and Collection Details       Routing       Result History     View All Conversations on this Encounter      CM=Additional comments      Result Care Coordination   Patient Communication   Add  Comments   Seen Back to Top      Iron and TIBC Order: [id number] Status: Final result      Visible to patient: Yes (seen)      Next appt: 11/11/2023 at 01:00 PM in Oncology ([facility])      Dx: H/O gastric bypass; Iron deficiency a...    0 Result Notes       Component Ref Range & Units 1 d ago 5 mo ago  Iron 28 - 170 ug/dL 77 253  TIBC 664 - 403 ug/dL 474 259  Saturation Ratios 10.4 - 31.8 % 22 33 High   UIBC ug/dL 563 875 CM  Comment: Performed at [facility], [address] [place], [place] [address]  Resulting Agency [facility] CLIN LAB [facility] CLIN LAB         Specimen Collected: 05/12/23 13:33 Last Resulted: 05/12/23 15:01      Lab Flowsheet  Order Clinical research associate History     View All Conversations on this Encounter      CM=Additional comments      Result Care Coordination   Patient Communication   Add Comments   Seen Back to Top      CBC with Differential/Platelet Order: [id number] Status: Final result      Visible to patient: Yes (seen)      Next appt: 11/11/2023 at 01:00 PM in Oncology ([facility])      Dx: H/O gastric bypass; Iron deficiency a...    0 Result Notes         Component Ref Range & Units 1 d ago 5 mo ago 3 yr ago 14 yr ago  WBC 4.0 - 10.5 K/uL 6.5 5.5 7.7 7.2  RBC 3.87 - 5.11 MIL/uL 4.61 4.98 5.19 High  5.11  Hemoglobin 12.0 - 15.0 g/dL 40.9 81.1 91.4 78.2  HCT 36.0 - 46.0 % 40.3 42.5 43.8 43.0  MCV 80.0 - 100.0 fL 87.4 85.3 84.4 84.0 R  MCH 26.0 - 34.0 pg 28.9 28.1 27.2   MCHC 30.0 - 36.0 g/dL 95.6 21.3 08.6 57.8  RDW 11.5 - 15.5 % 12.2 14.2 12.8 12.7  Platelets 150 - 400 K/uL 270 270 303 247  nRBC 0.0 - 0.2 % 0.0 0.0 0.0 CM   Neutrophils Relative % % 50 47    Neutro Abs 1.7 - 7.7 K/uL 3.2 2.6    Lymphocytes Relative % 34 38    Lymphs Abs 0.7 - 4.0 K/uL 2.2 2.1    Monocytes Relative % 9 10    Monocytes  Absolute 0.1 - 1.0 K/uL 0.6 0.5    Eosinophils Relative % 6 4    Eosinophils Absolute 0.0 - 0.5 K/uL 0.4 0.2    Basophils Relative % 1 1    Basophils Absolute 0.0 - 0.1 K/uL 0.1 0.1    Immature Granulocytes % 0 0    Abs Immature Granulocytes 0.00 - 0.07 K/uL 0.01 0.01 CM    Comment: Performed at [facility], [address] [place], [place] [address]  Resulting Agency [facility] CLIN LAB         Specimen Collected: 05/12/23 13:33 Last Resulted: 05/12/23 13:41      Lab Flowsheet       Order Details       View Encounter       Lab and Collection Details       Routing       Result History     View All Conversations on this Encounter      CM=Additional comments  R=Reference range differs from displayed range      Result Care Coordination   Patient Communication   Add Comments   Seen Back to Top       Contains abnormal data Vitamin B12 Order: [id number] Status: Final result      Visible to patient: Yes (seen)      Next appt: 11/11/2023 at 01:00 PM in Oncology ([facility])      Dx: H/O gastric bypass; Iron deficiency a...    0 Result Notes      Component Ref Range & Units 1 d ago  Vitamin B-12 180 - 914 pg/mL 1,309 High   Comment: (  NOTE) This assay is not validated for testing neonatal or myeloproliferative syndrome specimens for Vitamin B12 levels. Performed at [facility], [address] [place], [place] [address]  Resulting Agency [facility] CLIN LAB         Specimen Collected: 05/12/23 13:32 Last Resulted: 05/13/23 00:03

## 2023-05-16 ENCOUNTER — Encounter: Payer: Self-pay | Admitting: Internal Medicine

## 2023-05-16 NOTE — Telephone Encounter (Signed)
Call returned to patient and it went to voice mail. I left message not identifying patient name that doctor not worried about her results and to keep her appointment as scheduled for 6 months

## 2023-06-06 IMAGING — RF DG ESOPHAGUS
8 series · 14 of 24 positions shown · non-contrast
Comparison: None.

CLINICAL DATA: Dysphagia.

EXAM:
ESOPHOGRAM / BARIUM SWALLOW / BARIUM TABLET STUDY
TECHNIQUE: Combined double contrast and single contrast examination performed
using effervescent crystals, thick barium liquid, and thin barium
liquid. The patient was observed with fluoroscopy swallowing a 13 mm
barium sulphate tablet.
FLUOROSCOPY TIME:  Fluoroscopy Time:  1 minute and 18 seconds
Radiation Exposure Index (if provided by the fluoroscopic device):
31.4 mGy
Number of Acquired Spot Images:

[Series 1: fluoro_barium 2fps_bw · 0.17mm/px · 2 of 14 frames shown (1 of 3)]
[frame 3/14]
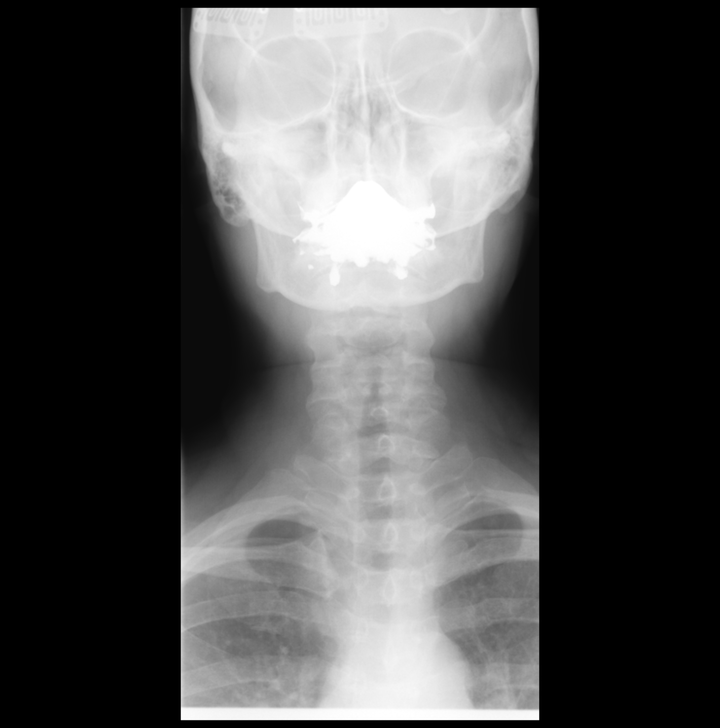
[frame 8/14]
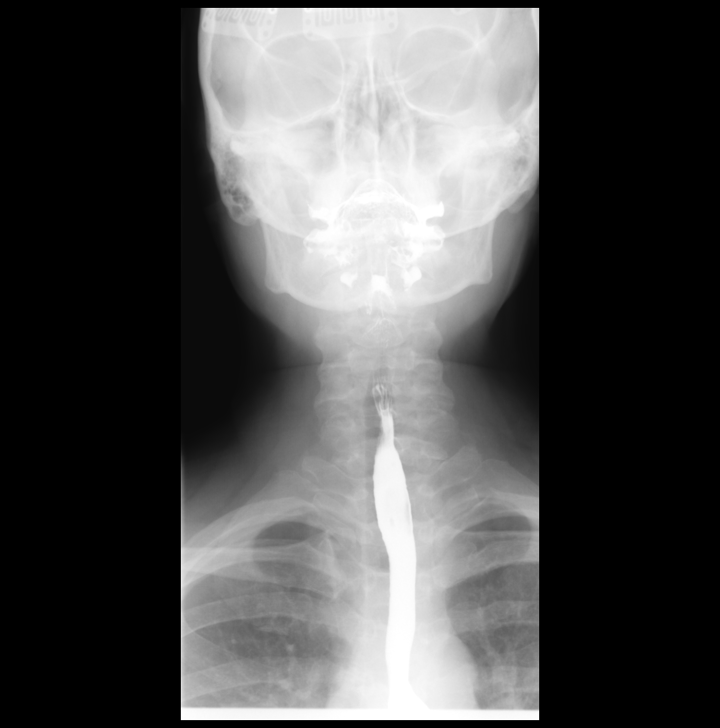

[Series 2: fluoro_barium 2fps_bw · 0.17mm/px · 2 of 10 frames shown (2 of 3)]
[frame 4/10]
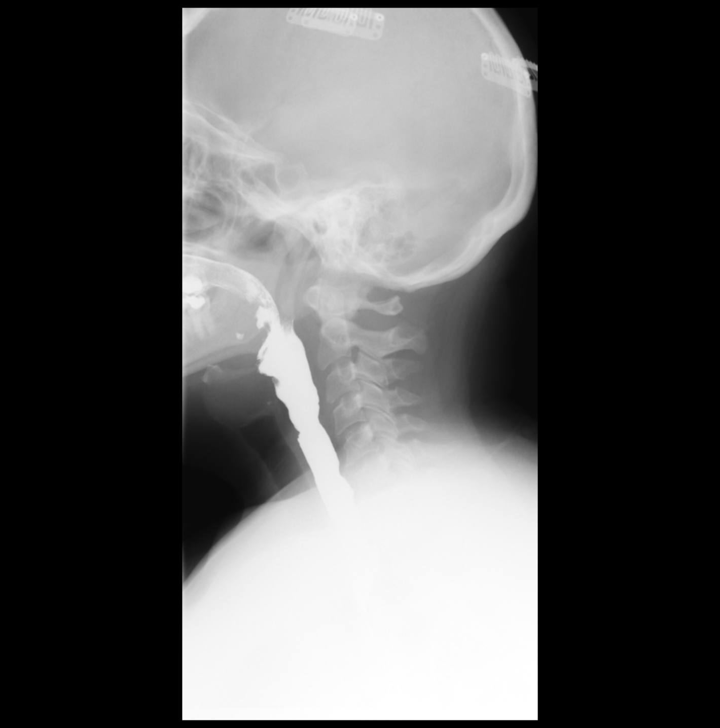
[frame 9/10]
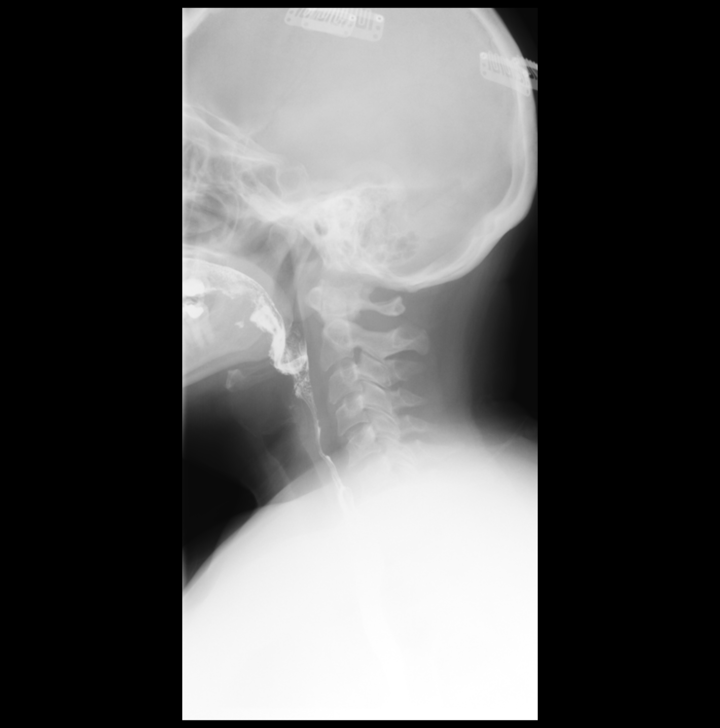

[Series 3: fluoro_barium 2fps_bw · 0.18mm/px · 2 of 40 frames shown (3 of 3)]
[frame 7/40]
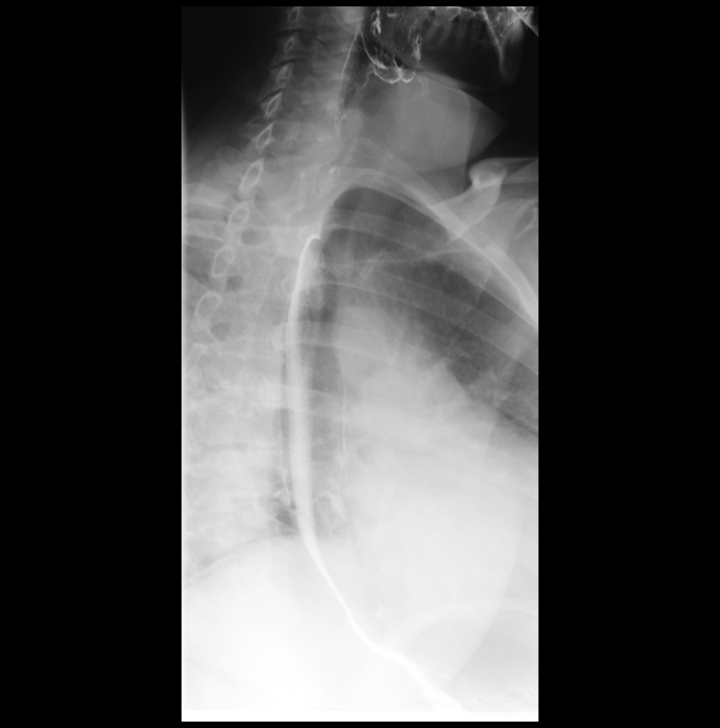
[frame 24/40]
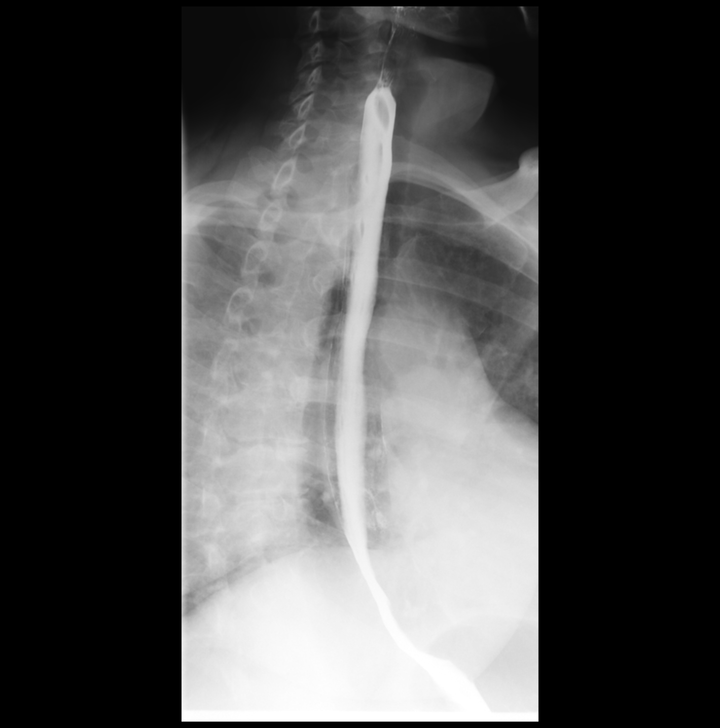

[Series 4: cp_standard · 0.55mm/px · 2 of 16 frames shown (1 of 5)]
[frame 3/16]
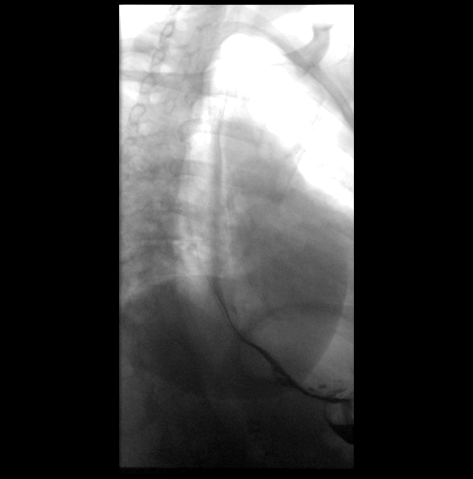
[frame 14/16]
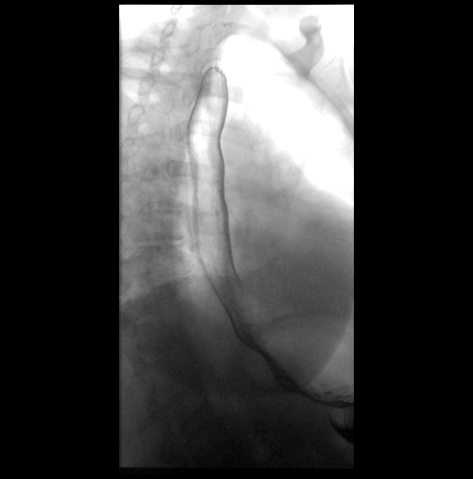

[Series 5: cp_standard · 0.55mm/px · 2 of 37 frames shown (2 of 5)]
[frame 19/37]
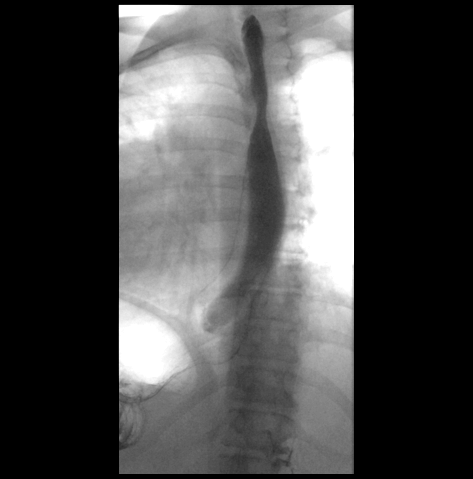
[frame 35/37]
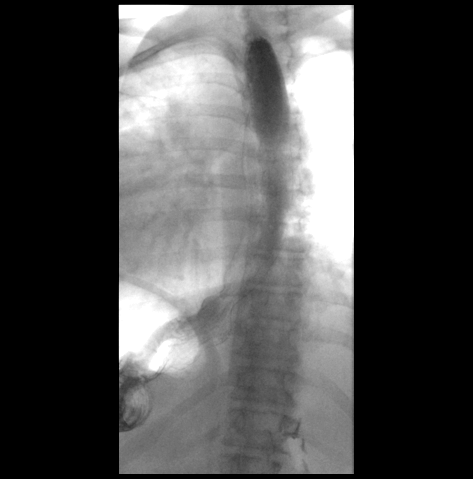

[Series 6: cp_standard · 0.55mm/px · 2 of 27 frames shown (3 of 5)]
[frame 5/27]
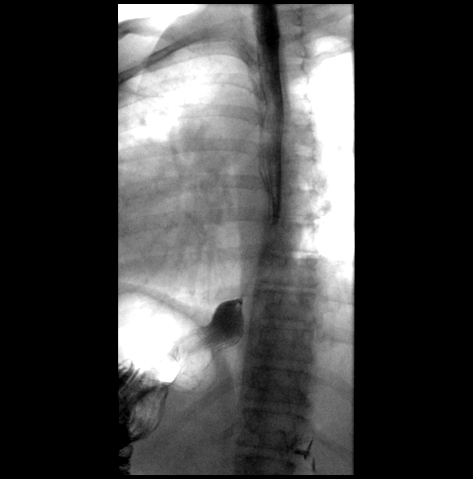
[frame 14/27]
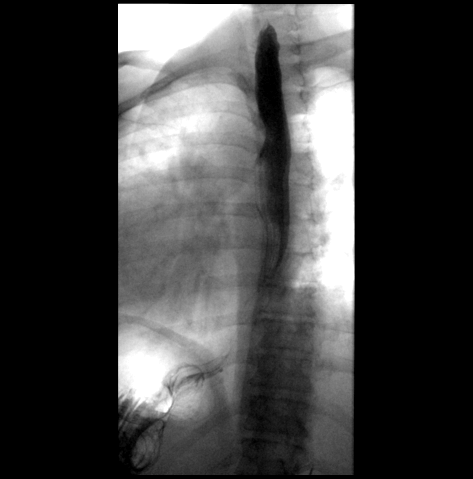

[Series 7: cp_standard · 0.55mm/px · 1 of 8 frames shown (4 of 5)]
[frame 5/8]
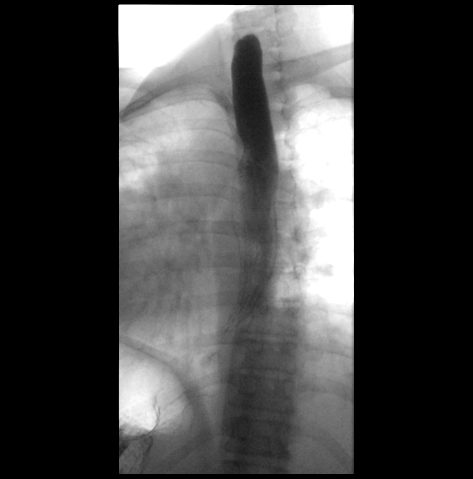

[Series 8: cp_standard · 0.27mm/px · 1 of 1 slices shown (5 of 5)]
[im 1/1]
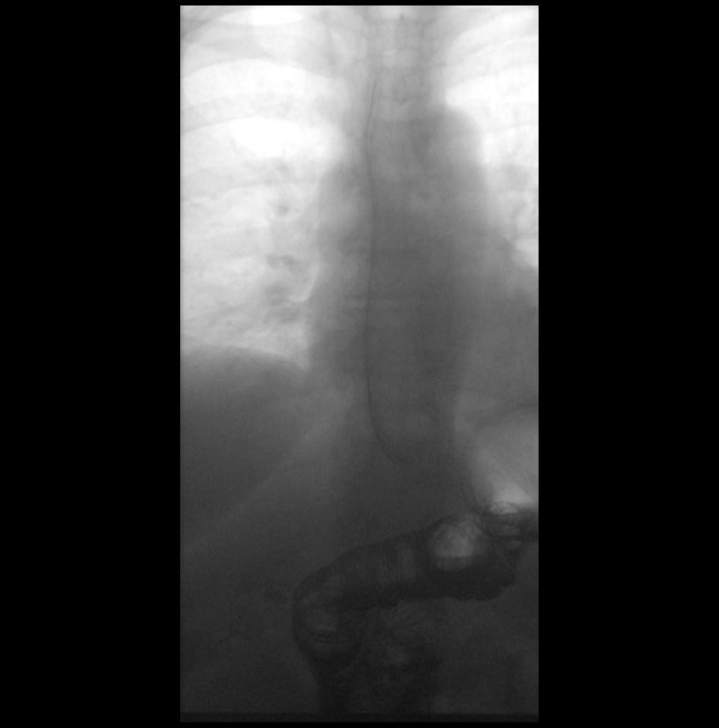

[14 of 24 positions shown; findings below may reference images not displayed]

FINDINGS: Frontal and lateral views of the hypopharynx while swallowing thick
barium are normal. Double contrast imaging of the esophagus is
normal.

Assessment of motility reveals disruption of primary peristalsis
near the junction of the proximal and middle thirds on all swallows.
No hiatal hernia.

13 mm barium tablet passes readily into the stomach when taken with
water.
IMPRESSION: Nonspecific esophageal motility disorder. Otherwise unremarkable
exam.

## 2023-06-07 ENCOUNTER — Other Ambulatory Visit: Payer: Self-pay | Admitting: Medical Genetics

## 2023-06-07 DIAGNOSIS — Z006 Encounter for examination for normal comparison and control in clinical research program: Secondary | ICD-10-CM

## 2023-07-14 ENCOUNTER — Other Ambulatory Visit: Payer: Self-pay | Admitting: Gastroenterology

## 2023-07-19 ENCOUNTER — Other Ambulatory Visit: Payer: Self-pay | Admitting: Medical

## 2023-07-19 DIAGNOSIS — M25512 Pain in left shoulder: Secondary | ICD-10-CM

## 2023-07-21 ENCOUNTER — Ambulatory Visit
Admission: RE | Admit: 2023-07-21 | Discharge: 2023-07-21 | Disposition: A | Discharge status: Home / Self Care | Payer: BC Managed Care – PPO | Source: Ambulatory Visit | Attending: Medical | Admitting: Medical

## 2023-07-21 DIAGNOSIS — M25512 Pain in left shoulder: Secondary | ICD-10-CM

## 2023-07-29 ENCOUNTER — Other Ambulatory Visit: Payer: Self-pay | Admitting: Gastroenterology

## 2023-08-15 ENCOUNTER — Other Ambulatory Visit: Payer: Self-pay | Admitting: Gastroenterology

## 2023-09-15 ENCOUNTER — Other Ambulatory Visit: Payer: Self-pay | Admitting: Gastroenterology

## 2023-10-14 ENCOUNTER — Other Ambulatory Visit: Payer: Self-pay | Admitting: Gastroenterology

## 2023-10-21 ENCOUNTER — Other Ambulatory Visit: Payer: Self-pay | Admitting: Gastroenterology

## 2023-11-07 ENCOUNTER — Encounter: Payer: Self-pay | Admitting: Internal Medicine

## 2023-11-07 ENCOUNTER — Inpatient Hospital Stay: Payer: BC Managed Care – PPO | Admitting: Internal Medicine

## 2023-11-07 ENCOUNTER — Inpatient Hospital Stay: Payer: BC Managed Care – PPO | Attending: Internal Medicine

## 2023-11-07 VITALS — BP 129/86 | HR 63 | Temp 97.4°F | Resp 16 | Wt 215.0 lb

## 2023-11-07 DIAGNOSIS — R946 Abnormal results of thyroid function studies: Secondary | ICD-10-CM

## 2023-11-07 DIAGNOSIS — Z9884 Bariatric surgery status: Secondary | ICD-10-CM | POA: Insufficient documentation

## 2023-11-07 DIAGNOSIS — D509 Iron deficiency anemia, unspecified: Secondary | ICD-10-CM

## 2023-11-07 LAB — CBC WITH DIFFERENTIAL/PLATELET
Abs Immature Granulocytes: 0.02 10*3/uL (ref 0.00–0.07)
Basophils Absolute: 0 10*3/uL (ref 0.0–0.1)
Basophils Relative: 0 %
Eosinophils Absolute: 0.4 10*3/uL (ref 0.0–0.5)
Eosinophils Relative: 6 %
HCT: 39.7 % (ref 36.0–46.0)
Hemoglobin: 13.2 g/dL (ref 12.0–15.0)
Immature Granulocytes: 0 %
Lymphocytes Relative: 39 %
Lymphs Abs: 2.4 10*3/uL (ref 0.7–4.0)
MCH: 28.2 pg (ref 26.0–34.0)
MCHC: 33.2 g/dL (ref 30.0–36.0)
MCV: 84.8 fL (ref 80.0–100.0)
Monocytes Absolute: 0.5 10*3/uL (ref 0.1–1.0)
Monocytes Relative: 9 %
Neutro Abs: 2.7 10*3/uL (ref 1.7–7.7)
Neutrophils Relative %: 46 %
Platelets: 235 10*3/uL (ref 150–400)
RBC: 4.68 MIL/uL (ref 3.87–5.11)
RDW: 12.6 % (ref 11.5–15.5)
WBC: 6 10*3/uL (ref 4.0–10.5)
nRBC: 0 % (ref 0.0–0.2)

## 2023-11-07 LAB — IRON AND TIBC
Iron: 58 ug/dL (ref 28–170)
Saturation Ratios: 19 % (ref 10.4–31.8)
TIBC: 309 ug/dL (ref 250–450)
UIBC: 251 ug/dL

## 2023-11-07 LAB — VITAMIN B12: Vitamin B-12: 695 pg/mL (ref 180–914)

## 2023-11-07 LAB — FERRITIN: Ferritin: 85 ng/mL (ref 11–307)

## 2023-11-07 NOTE — Progress Notes (Signed)
 [facility]  [phone]) [phone] [phone]) [phone]  ID: Brandy Lewis OB: 1961/05/04  MR#: [id number]  MVH#:[id number]  Patient Care Team: Brandy Lewis., MD as PCP - General (Family Medicine) Brandy Rodney, MD as Consulting Physician (Oncology)  REFERRING PROVIDER: Dr. Rex Lewis  REASON FOR REFERRAL: IDA  HPI: Brandy Lewis is a 63 y.o. female with past medical history of obesity, prediabetes and gastric bypass surgery was referred to hematology for management of iron deficiency anemia.  Patient has a history of hemorrhoids.  She started Mounjaro for weight loss in May 2023.  Since has noticed more constipation and mild bleeding from hemorrhoids. Patient had labs done on 09/05/2022.  Iron 16, saturation 4%.  Ferritin not done.  WBC 4.8, hemoglobin 13.1, MCV 82, platelet 267.  B12 830.  Folate>24.  Last colonoscopy done on 08/11/2018 at [facility] by Dr. Kavitha Lewis showed 1 polyp in ascending colon, diverticuli and nonbleeding internal hemorrhoids.  She started iron pills but could not tolerate due to worsening constipation.  Completed 2 doses of IV Feraheme  in January 2024.  Interval history Patient was seen today for iron deficiency anemia, labs. Recently, she had left rotator cuff injury s/p repair.  Undergoing physical therapy.  Continues to have left ear tinnitus.  Has seen ENT.  Allergies have improved.  REVIEW OF SYSTEMS:   ROS  As per HPI. Otherwise, a complete review of systems is negative.  PAST MEDICAL HISTORY: Past Medical History:  Diagnosis Date   Allergy    Chronic constipation    Fatty liver    Gallstones     PAST SURGICAL HISTORY: Past Surgical History:  Procedure Laterality Date   BARIATRIC SURGERY     BIOPSY  08/11/2018   Procedure: BIOPSY;  Surgeon: Brandy Dandy, MD;  Location: [facility];  Service: Endoscopy;;   CHOLECYSTECTOMY     COLONOSCOPY WITH PROPOFOL  [name] 08/11/2018   Procedure: COLONOSCOPY WITH  PROPOFOL ;  Surgeon: Brandy Dandy, MD;  Location: [facility];  Service: Endoscopy;  Laterality: [name];   DILATION AND CURETTAGE OF UTERUS     ENDOMETRIAL ABLATION     GALLBLADDER SURGERY     GASTRIC BYPASS     POLYPECTOMY  08/11/2018   Procedure: POLYPECTOMY;  Surgeon: Brandy Dandy, MD;  Location: [facility];  Service: Endoscopy;;    FAMILY HISTORY: Family History  Problem Relation Age of Onset   Lung cancer Mother    Diabetes Father    Heart disease Father    Colon cancer Maternal Uncle    Rectal cancer Neg Hx    Stomach cancer Neg Hx    Esophageal cancer Neg Hx     HEALTH MAINTENANCE: Social History   Tobacco Use   Smoking status: Never    Passive exposure: Yes   Smokeless tobacco: Never   Tobacco comments:    Significant second hand smoke exposure as a child  Vaping Use   Vaping status: Never Used  Substance Use Topics   Alcohol use: No   Drug use: No     Allergies  Allergen Reactions   Molnupiravir Hives   Diflucan [Fluconazole]     Causes yeast infections    Latex Rash   Tape Rash    Current Outpatient Medications  Medication Sig Dispense Refill   cetirizine (ZYRTEC) 5 MG chewable tablet Chew 5 mg by mouth daily.     esomeprazole  (NEXIUM ) 40 MG capsule TAKE 1 CAPSULE BY MOUTH ONCE DAILY 30  MINUTES  BEFORE  BREAKFAST 90 capsule 0   famotidine  (PEPCID ) 40 MG tablet TAKE 1 TABLET BY MOUTH AT BEDTIME 30 tablet 11   fexofenadine (ALLEGRA ALLERGY) 180 MG tablet Take 180 mg by mouth daily.     MOUNJARO 15 MG/0.5ML Pen SMARTSIG:1 Pre-Filled Pen Syringe SUB-Q Once a Week     Multiple Vitamins-Minerals (ADULT GUMMY) CHEW Chew 4 tablets by mouth daily.     Prenatal Vit-Fe Fumarate-FA (MULTIVITAMIN-PRENATAL) 27-0.8 MG TABS tablet Take 1 tablet by mouth daily at 12 noon.     TRULANCE  3 MG TABS Take 1 tablet by mouth once daily 30 tablet 0   lubiprostone  (AMITIZA ) 24 MCG capsule Take 1 capsule (24 mcg total) by mouth 2 (two) times daily with a meal.  (Patient not taking: Reported on 11/07/2023) 60 capsule 3   No current facility-administered medications for this visit.    OBJECTIVE: Vitals:   11/07/23 1503  BP: 129/86  Pulse: 63  Resp: 16  Temp: (!) 97.4 F (36.3 C)  SpO2: 100%      Body mass index is 39.32 kg/m.      General: Well-developed, well-nourished, no acute distress. Eyes: Pink conjunctiva, anicteric sclera. HEENT: Normocephalic, moist mucous membranes, clear oropharnyx. Lungs: Clear to auscultation bilaterally. Heart: Regular rate and rhythm. No rubs, murmurs, or gallops. Abdomen: Soft, nontender, nondistended. No organomegaly noted, normoactive bowel sounds. Musculoskeletal: No edema, cyanosis, or clubbing. Neuro: Alert, answering all questions appropriately. Cranial nerves grossly intact. Skin: No rashes or petechiae noted. Psych: Normal affect. Lymphatics: No cervical, calvicular, axillary or inguinal LAD.   LAB RESULTS:  Lab Results  Component Value Date   NA 141 12/06/2019   K 4.2 12/06/2019   CL 110 12/06/2019   CO2 22 12/06/2019   GLUCOSE 114 (H) 12/06/2019   BUN 21 (H) 12/06/2019   CREATININE 0.87 12/06/2019   CALCIUM 9.1 12/06/2019   GFRNONAA >60 12/06/2019   GFRAA >60 12/06/2019    Lab Results  Component Value Date   WBC 6.0 11/07/2023   NEUTROABS 2.7 11/07/2023   HGB 13.2 11/07/2023   HCT 39.7 11/07/2023   MCV 84.8 11/07/2023   PLT 235 11/07/2023    Lab Results  Component Value Date   TIBC 344 05/12/2023   TIBC 346 12/06/2022   FERRITIN 73 05/12/2023   FERRITIN 101 12/06/2022   IRONPCTSAT 22 05/12/2023   IRONPCTSAT 33 (H) 12/06/2022     STUDIES: No results found.  ASSESSMENT AND PLAN:   [name] is a 63 y.o. female with pmh of obesity, prediabetes was referred to hematology for management of iron deficiency anemia.  # Iron deficiency anemia #Gastric bypass surgery  -Likely malabsorption from gastric bypass surgery.  Upper endoscopy from 12/19/2022 with Dr.  Darleen Ee showed changes from gastric bypass surgery but otherwise unremarkable.  Colonoscopy showed one 4 mm polyp at the hepatic flexure.  Path showed tubular adenoma.   -Labs from December 2023 showed saturation 4%, ferritin was not available and normal hemoglobin.  Received IV Feraheme  x 2 doses.   -CBC with differential today showed normal hemoglobin of 13.2.  Iron panel is pending.  Will call the patient if iron is low and will schedule for 2 doses of IV Feraheme .  Continue with iron supplements.  She is tolerating well. -B12 level is pending.  # Abnormal TFTs -Per patient, she was found to have abnormal TFT and would prefer repeat testing.  -TFTs are pending.  # Environmental allergies -Following with Dr. Valiant Gaul, ENT.  Had allergy testing  done and was positive for multiple environmental exposure. -On antihistamine and Singulair.  Moderate improvement in symptoms.  # Left ear pulsatile pain -Seen by ENT.  Of chart, had MRI which showed mastoid disease but no other acute concerning symptoms.  Orders Placed This Encounter  Procedures   CBC with Differential (Cancer Center Only)   CMP (Cancer Center only)   Ferritin   Iron and TIBC([facility])   Vitamin B12   RTC in 8 months for MD visit, labs  Patient expressed understanding and was in agreement with this plan. She also understands that She can call clinic at any time with any questions, concerns, or complaints.   I spent a total of 25 minutes reviewing chart data, face-to-face evaluation with the patient, counseling and coordination of care as detailed above.  Messi Twedt, MD   11/07/2023 3:25 PM

## 2023-11-07 NOTE — Progress Notes (Signed)
 Patient had rotator cuff left shoulder surgery on November 21st of 2024. She is healing well, her scar looks good. She is still going through therapy for about another month or two.

## 2023-11-11 ENCOUNTER — Ambulatory Visit: Payer: BC Managed Care – PPO | Admitting: Internal Medicine

## 2023-11-11 ENCOUNTER — Other Ambulatory Visit: Payer: BC Managed Care – PPO

## 2023-11-12 ENCOUNTER — Other Ambulatory Visit: Payer: Self-pay | Admitting: Gastroenterology

## 2023-11-25 ENCOUNTER — Other Ambulatory Visit: Payer: Self-pay | Admitting: Gastroenterology

## 2023-11-28 ENCOUNTER — Telehealth: Payer: Self-pay

## 2023-11-28 NOTE — Telephone Encounter (Signed)
 Pharmacy Patient Advocate Encounter   Received notification from CoverMyMeds that prior authorization for Trulance 3 mg tablets is required/requested.   Insurance verification completed.   The patient is insured through [facility] .   Per test claim: PA required; PA submitted to above mentioned insurance via CoverMyMeds Key/confirmation #/EOC [id number] Status is pending

## 2023-12-02 ENCOUNTER — Other Ambulatory Visit
Admission: RE | Admit: 2023-12-02 | Discharge: 2023-12-02 | Disposition: A | Discharge status: Home / Self Care | Payer: Self-pay | Source: Ambulatory Visit | Attending: Medical Genetics | Admitting: Medical Genetics

## 2023-12-02 DIAGNOSIS — Z006 Encounter for examination for normal comparison and control in clinical research program: Secondary | ICD-10-CM | POA: Insufficient documentation

## 2023-12-05 ENCOUNTER — Other Ambulatory Visit (HOSPITAL_COMMUNITY): Payer: Self-pay

## 2023-12-06 NOTE — Telephone Encounter (Signed)
 Pharmacy Patient Advocate Encounter  Received notification from [facility] that Prior Authorization for TRULANCE 3MG  has been APPROVED from 3.3.25 to 3.3.26   PA #/Case ID/Reference #: [id number]

## 2023-12-13 ENCOUNTER — Other Ambulatory Visit: Payer: Self-pay | Admitting: Gastroenterology

## 2023-12-16 LAB — GENECONNECT MOLECULAR SCREEN: Genetic Analysis Overall Interpretation: NEGATIVE

## 2023-12-26 ENCOUNTER — Other Ambulatory Visit: Payer: Self-pay | Admitting: Gastroenterology

## 2024-01-11 ENCOUNTER — Other Ambulatory Visit: Payer: Self-pay | Admitting: Gastroenterology

## 2024-01-14 IMAGING — CT CT CHEST W/O CM
2 of 4 series · 15 of 36 positions shown, 18 images · non-contrast
Comparison: March 09, 2021

CLINICAL DATA: lung nodules, follow-up study



[Series 2: thorax · axial · 0.75mm/px · z∈[-313,-49]mm · 12 of 156 slices shown, 15 images]
[im 12/156  mediastinal]
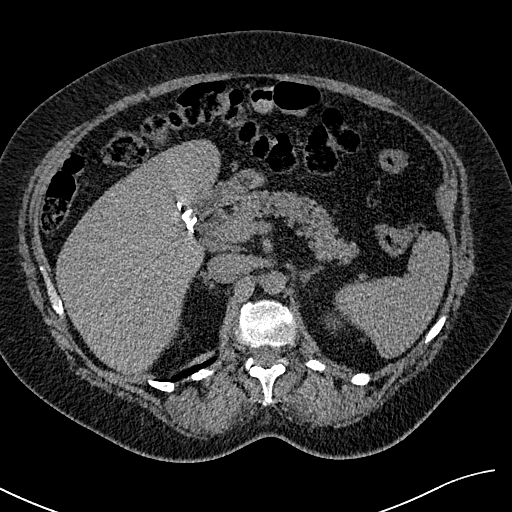
[im 12/156  lung]
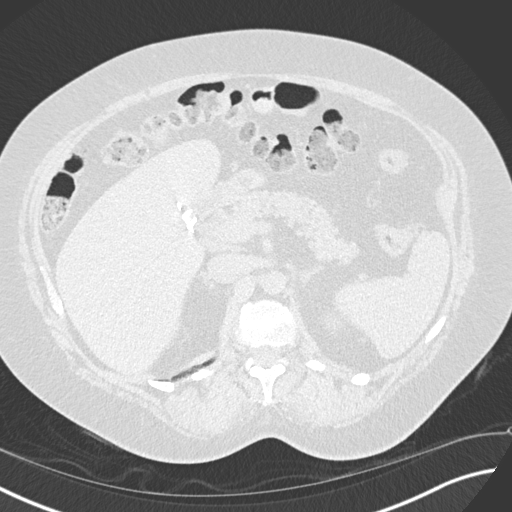
[im 24/156  lung]
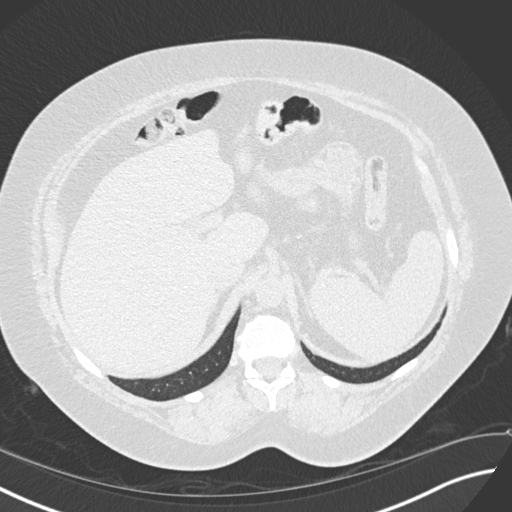
[im 36/156  lung]
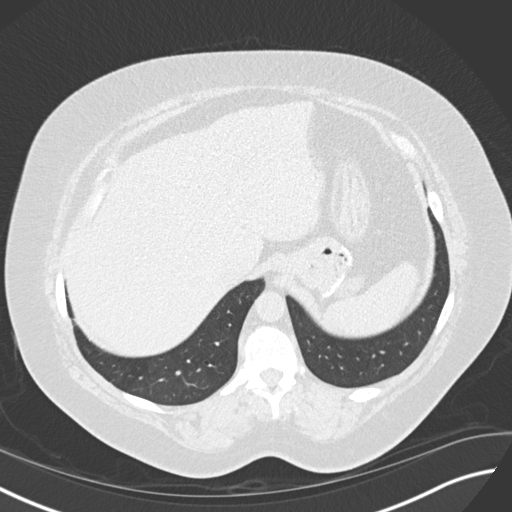
[im 48/156  lung]
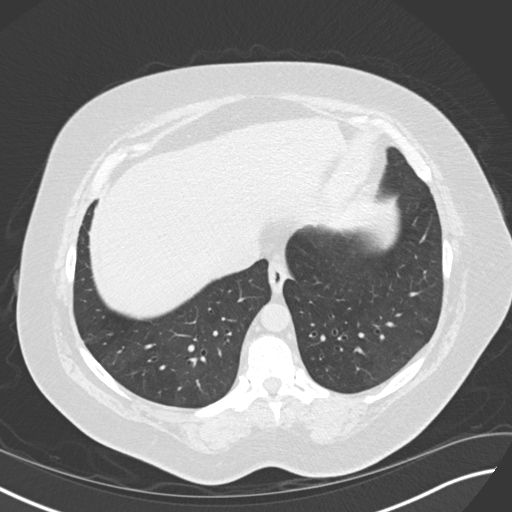
[im 60/156  mediastinal]
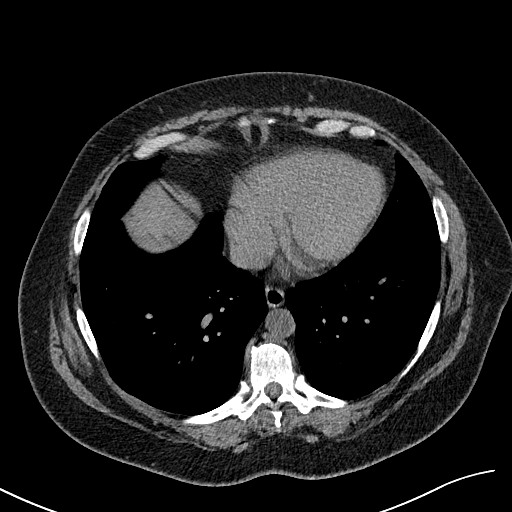
[im 60/156  lung]
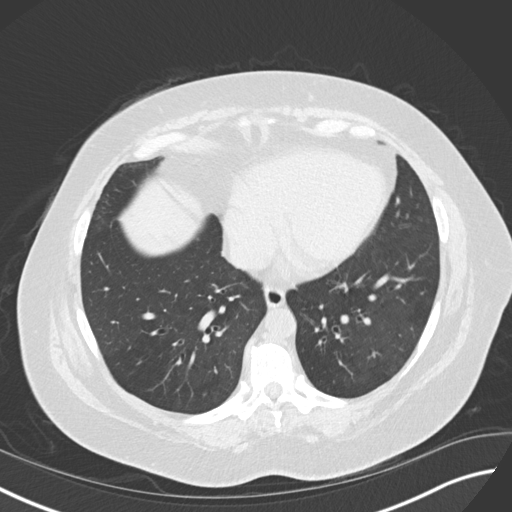
[im 72/156  lung]
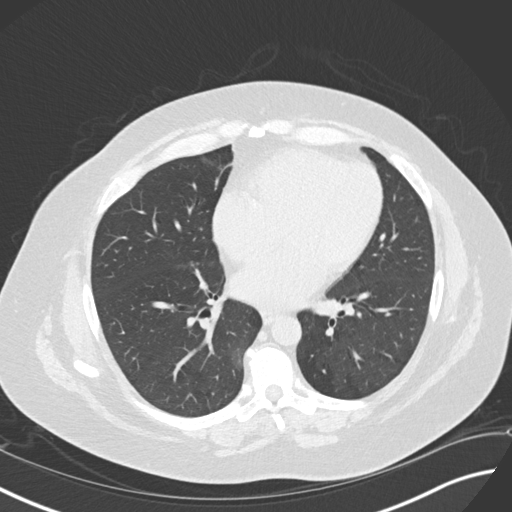
[im 84/156  lung]
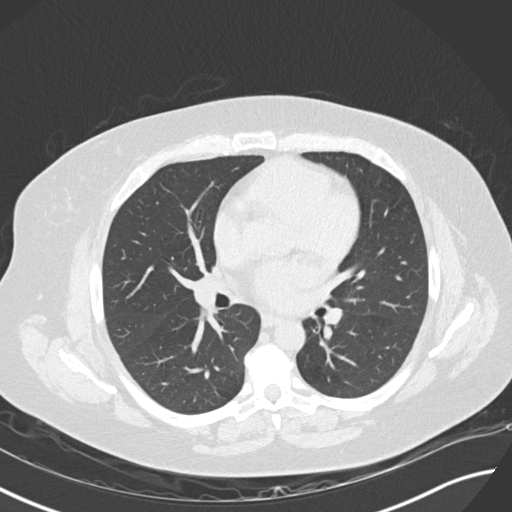
[im 96/156  lung]
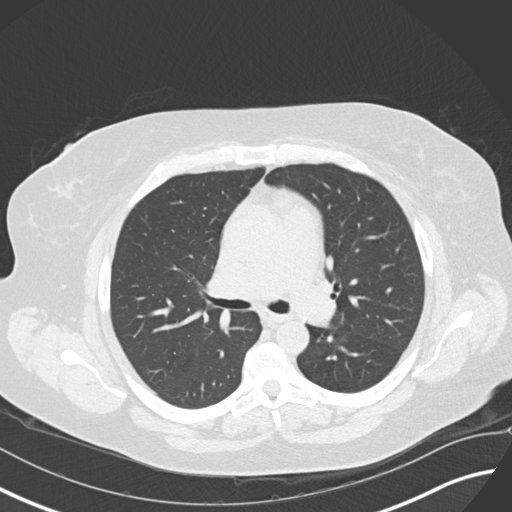
[im 108/156  mediastinal]
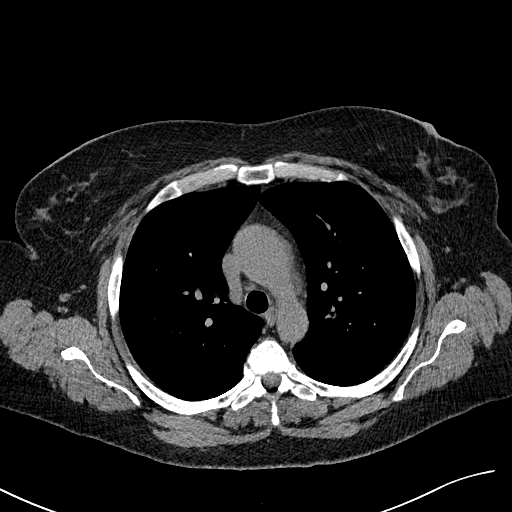
[im 108/156  lung]
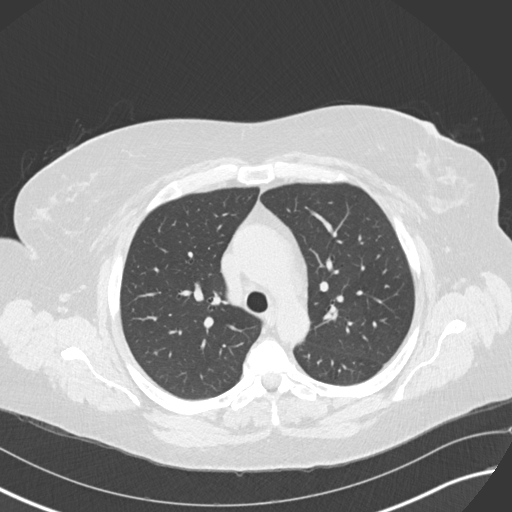
[im 120/156  lung]
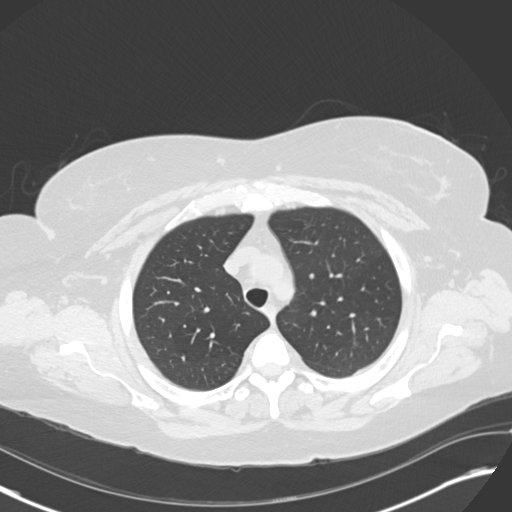
[im 132/156  lung]
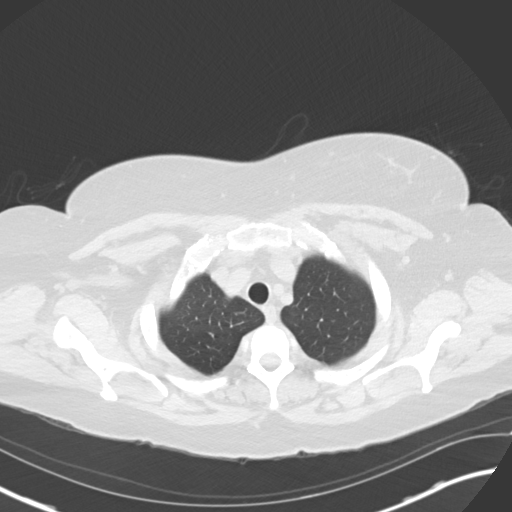
[im 144/156  lung]
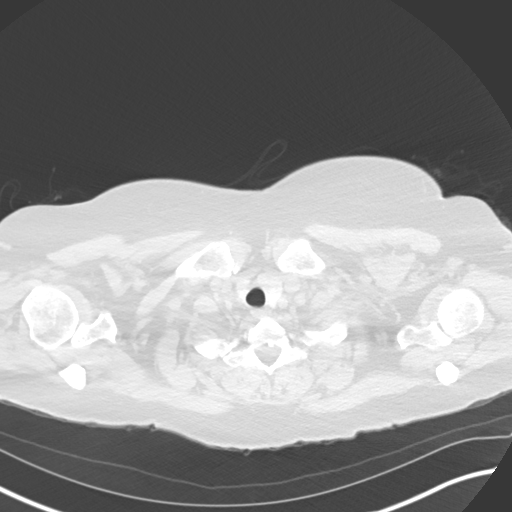

[Series 5: coronal · coronal · 0.61mm/px · 3 of 129 slices shown]
[im 26/129  lung]
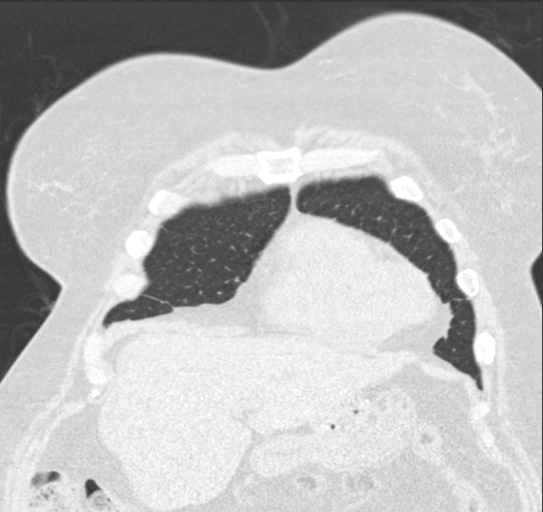
[im 52/129  lung]
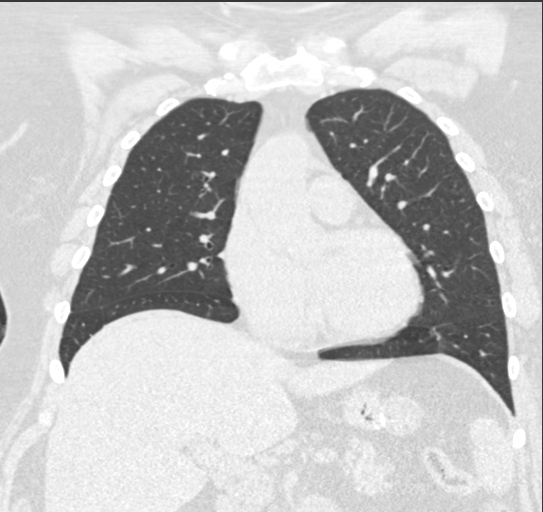
[im 77/129  lung]
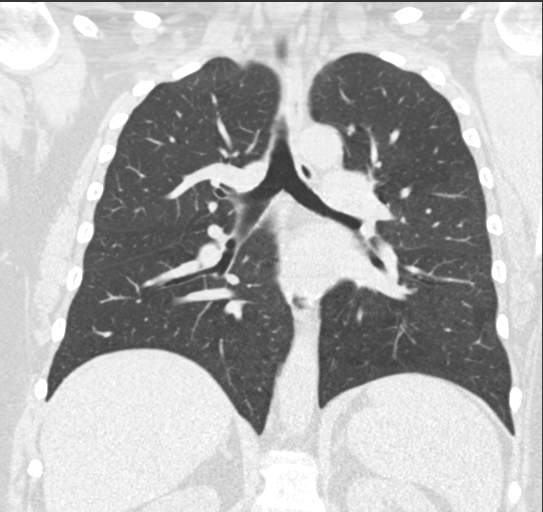

[15 of 36 positions shown; findings below may reference images not displayed]

FINDINGS: Cardiovascular: No significant vascular findings. Normal heart size.
No pericardial effusion. Mild coronary artery calcifications.

Mediastinum/Nodes: No enlarged mediastinal or axillary lymph nodes.
Thyroid gland, trachea, and esophagus demonstrate no significant
findings.

Lungs/Pleura: No acute pulmonary findings. No consolidation,
infiltrates, vascular congestion or effusions. No interstitial lung
disease or bronchiectasis.

Stable 3 mm nodule at the right lower lobe (image 117 of series 3)
and 4 mm nodule in the left lower lobe (image 102 of series 3). In
addition, there is punctate 2 mm subpleural nodule in the left upper
lobe (image 69 of series 3, stable.

Upper Abdomen: No acute abnormality. Status post cholecystectomy and
gastric bypass changes.

Musculoskeletal: Moderate thoracic spondylosis with prominent
anterior marginal osteophytes.
IMPRESSION: nodule seen. No further follow-up is recommended.

No mediastinal or hilar mass or lymphadenopathy.

Status post cholecystectomy and gastric bypass changes. No
complicating features.

## 2024-01-22 ENCOUNTER — Other Ambulatory Visit: Payer: Self-pay | Admitting: Gastroenterology

## 2024-01-26 ENCOUNTER — Other Ambulatory Visit: Payer: Self-pay | Admitting: Gastroenterology

## 2024-01-31 ENCOUNTER — Telehealth: Payer: Self-pay | Admitting: Gastroenterology

## 2024-01-31 MED ORDER — ESOMEPRAZOLE MAGNESIUM 40 MG PO CPDR: 40.0000 mg | DELAYED_RELEASE_CAPSULE | Freq: Every morning | ORAL | 3 refills | Status: AC

## 2024-01-31 NOTE — Telephone Encounter (Signed)
 PT is calling to get refill on esomeprazole . Pharmacy told her she needed new prescription sent in to [facility]. Please advise

## 2024-01-31 NOTE — Telephone Encounter (Signed)
 Nexium  40 mg sent to [address] today

## 2024-02-07 ENCOUNTER — Other Ambulatory Visit: Payer: Self-pay | Admitting: Gastroenterology

## 2024-02-19 ENCOUNTER — Other Ambulatory Visit: Payer: Self-pay | Admitting: Gastroenterology

## 2024-03-09 ENCOUNTER — Other Ambulatory Visit: Payer: Self-pay | Admitting: Gastroenterology

## 2024-04-01 ENCOUNTER — Ambulatory Visit
Admission: EM | Admit: 2024-04-01 | Discharge: 2024-04-01 | Disposition: A | Discharge status: Home / Self Care | Attending: Family Medicine | Admitting: Family Medicine

## 2024-04-01 DIAGNOSIS — L03213 Periorbital cellulitis: Secondary | ICD-10-CM

## 2024-04-01 MED ORDER — CEFDINIR 300 MG PO CAPS: 300.0000 mg | ORAL_CAPSULE | Freq: Two times a day (BID) | ORAL | 0 refills | Status: AC

## 2024-04-01 NOTE — ED Triage Notes (Signed)
 Pt reports swelling, redness and greens drainage in the left upper eyelid x 2 days; left sided facial pain and swelling x 1 day. Taking Tylenol, hot compress.

## 2024-04-01 NOTE — ED Provider Notes (Signed)
 [facility]  Note:  This document was prepared using Conservation officer, historic buildings and may include unintentional dictation errors.  MRN: [id number] DOB: 1961-04-13  Subjective:   Brandy Lewis is a 63 y.o. female presenting for 2-day history of acute onset persistent and worsening left upper eyelid redness, swelling, pain.  Feels the pain is radiating to the left side of her face and has a knot there.  Has previously had styes that resolved without much issue but this morning has been the worst.  No current facility-administered medications for this encounter.  Current Outpatient Medications:    cetirizine (ZYRTEC) 5 MG chewable tablet, Chew 5 mg by mouth daily., Disp: , Rfl:    esomeprazole  (NEXIUM ) 40 MG capsule, Take 1 capsule (40 mg total) by mouth every morning., Disp: 90 capsule, Rfl: 3   famotidine  (PEPCID ) 40 MG tablet, TAKE 1 TABLET BY MOUTH AT BEDTIME, Disp: 30 tablet, Rfl: 3   fexofenadine (ALLEGRA ALLERGY) 180 MG tablet, Take 180 mg by mouth daily., Disp: , Rfl:    lubiprostone  (AMITIZA ) 24 MCG capsule, Take 1 capsule (24 mcg total) by mouth 2 (two) times daily with a meal. (Patient not taking: Reported on 11/07/2023), Disp: 60 capsule, Rfl: 3   MOUNJARO 15 MG/0.5ML Pen, SMARTSIG:1 Pre-Filled Pen Syringe SUB-Q Once a Week, Disp: , Rfl:    Multiple Vitamins-Minerals (ADULT GUMMY) CHEW, Chew 4 tablets by mouth daily., Disp: , Rfl:    Prenatal Vit-Fe Fumarate-FA (MULTIVITAMIN-PRENATAL) 27-0.8 MG TABS tablet, Take 1 tablet by mouth daily at 12 noon., Disp: , Rfl:    TRULANCE  3 MG TABS, Take 1 tablet by mouth once daily, Disp: 30 tablet, Rfl: 0   Allergies  Allergen Reactions   Molnupiravir Hives   Diflucan [Fluconazole]     Causes yeast infections    Latex Rash   Tape Rash    Past Medical History:  Diagnosis Date   Allergy    Chronic constipation    Fatty liver    Gallstones      Past Surgical History:  Procedure Laterality Date    BARIATRIC SURGERY     BIOPSY  08/11/2018   Procedure: BIOPSY;  Surgeon: Shila Gustav GAILS, MD;  Location: [facility];  Service: Endoscopy;;   CHOLECYSTECTOMY     COLONOSCOPY WITH PROPOFOL  [name] 08/11/2018   Procedure: COLONOSCOPY WITH PROPOFOL ;  Surgeon: Shila Gustav GAILS, MD;  Location: [facility];  Service: Endoscopy;  Laterality: [name];   DILATION AND CURETTAGE OF UTERUS     ENDOMETRIAL ABLATION     GALLBLADDER SURGERY     GASTRIC BYPASS     POLYPECTOMY  08/11/2018   Procedure: POLYPECTOMY;  Surgeon: Shila Gustav GAILS, MD;  Location: [facility];  Service: Endoscopy;;    Family History  Problem Relation Age of Onset   Lung cancer Mother    Diabetes Father    Heart disease Father    Colon cancer Maternal Uncle    Rectal cancer Neg Hx    Stomach cancer Neg Hx    Esophageal cancer Neg Hx     Social History   Tobacco Use   Smoking status: Never    Passive exposure: Yes   Smokeless tobacco: Never   Tobacco comments:    Significant second hand smoke exposure as a child  Vaping Use   Vaping status: Never Used  Substance Use Topics   Alcohol use: No   Drug use: No    ROS   Objective:   Vitals: There  were no vitals taken for this visit.  Physical Exam Constitutional:      General: She is not in acute distress.    Appearance: Normal appearance. She is well-developed. She is not ill-appearing, toxic-appearing or diaphoretic.  HENT:     Head: Normocephalic and atraumatic.     Nose: Nose normal.     Mouth/Throat:     Mouth: Mucous membranes are moist.  Eyes:     General: Lids are everted, no foreign bodies appreciated. Vision grossly intact. No scleral icterus.       Right eye: No foreign body, discharge or hordeolum.        Left eye: No foreign body, discharge or hordeolum.     Extraocular Movements: Extraocular movements intact.     Right eye: Normal extraocular motion.     Left eye: Normal extraocular motion and no nystagmus.     Conjunctiva/sclera:      Right eye: Right conjunctiva is not injected. No chemosis, exudate or hemorrhage.    Left eye: Left conjunctiva is not injected. No chemosis, exudate or hemorrhage.  Cardiovascular:     Rate and Rhythm: Normal rate.  Pulmonary:     Effort: Pulmonary effort is normal.  Skin:    General: Skin is warm and dry.  Neurological:     General: No focal deficit present.     Mental Status: She is alert and oriented to person, place, and time.  Psychiatric:        Mood and Affect: Mood normal.        Behavior: Behavior normal.      Assessment and Plan :   PDMP not reviewed this encounter.  1. Preseptal cellulitis of left upper eyelid    Will manage for preseptal cellulitis of the left upper eyelid with cefdinir .  Recommend supportive care otherwise.  Continue warm compresses. Counseled patient on potential for adverse effects with medications prescribed/recommended today, ER and return-to-clinic precautions discussed, patient verbalized understanding.    Christopher Savannah, [place] 04/01/24 1014

## 2024-04-04 ENCOUNTER — Encounter (INDEPENDENT_AMBULATORY_CARE_PROVIDER_SITE_OTHER): Payer: Self-pay

## 2024-04-06 ENCOUNTER — Other Ambulatory Visit: Payer: Self-pay | Admitting: Gastroenterology

## 2024-05-04 ENCOUNTER — Other Ambulatory Visit: Payer: Self-pay | Admitting: Gastroenterology

## 2024-06-03 ENCOUNTER — Other Ambulatory Visit: Payer: Self-pay | Admitting: Gastroenterology

## 2024-06-11 ENCOUNTER — Telehealth: Payer: Self-pay | Admitting: Internal Medicine

## 2024-06-11 NOTE — Telephone Encounter (Signed)
 Called patient to change upcoming appointment due to provider being out of office. No answer left voicemail to call back.

## 2024-06-12 ENCOUNTER — Other Ambulatory Visit: Payer: Self-pay | Admitting: Gastroenterology

## 2024-06-18 ENCOUNTER — Telehealth: Payer: Self-pay | Admitting: Internal Medicine

## 2024-06-18 NOTE — Telephone Encounter (Signed)
 Mailed new appointment to patient due to provider being out of office

## 2024-07-04 ENCOUNTER — Other Ambulatory Visit: Payer: Self-pay | Admitting: Gastroenterology

## 2024-07-13 ENCOUNTER — Other Ambulatory Visit: Payer: BC Managed Care – PPO

## 2024-07-13 ENCOUNTER — Ambulatory Visit: Payer: BC Managed Care – PPO | Admitting: Internal Medicine

## 2024-08-01 ENCOUNTER — Other Ambulatory Visit: Payer: Self-pay | Admitting: Gastroenterology

## 2024-08-30 ENCOUNTER — Other Ambulatory Visit: Payer: Self-pay | Admitting: Gastroenterology

## 2024-09-05 ENCOUNTER — Encounter: Payer: Self-pay | Admitting: *Deleted

## 2024-09-05 ENCOUNTER — Other Ambulatory Visit: Payer: Self-pay | Admitting: *Deleted

## 2024-09-05 DIAGNOSIS — D509 Iron deficiency anemia, unspecified: Secondary | ICD-10-CM

## 2024-09-07 ENCOUNTER — Inpatient Hospital Stay: Admitting: Internal Medicine

## 2024-09-07 ENCOUNTER — Inpatient Hospital Stay

## 2024-09-11 ENCOUNTER — Other Ambulatory Visit: Payer: Self-pay | Admitting: Gastroenterology

## 2024-10-10 ENCOUNTER — Other Ambulatory Visit: Payer: Self-pay | Admitting: Gastroenterology

## 2024-10-25 ENCOUNTER — Other Ambulatory Visit: Payer: Self-pay | Admitting: Gastroenterology
